# Patient Record
Sex: Female | Born: 1995 | Race: Black or African American | Hispanic: No | Marital: Single | Smoking: Never smoker
Health system: Southern US, Community
[De-identification: ages and names within clinical notes are randomized; demographics above are authoritative.]

## PROBLEM LIST (undated history)

## (undated) HISTORY — PX: NO PAST SURGERIES: SHX2092

---
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
## 2016-12-21 DIAGNOSIS — L739 Follicular disorder, unspecified: Secondary | ICD-10-CM | POA: Insufficient documentation

## 2016-12-21 DIAGNOSIS — B360 Pityriasis versicolor: Secondary | ICD-10-CM | POA: Insufficient documentation

## 2018-05-19 ENCOUNTER — Other Ambulatory Visit: Payer: Self-pay

## 2018-05-19 ENCOUNTER — Emergency Department (HOSPITAL_COMMUNITY)
Admission: EM | Admit: 2018-05-19 | Discharge: 2018-05-19 | Disposition: A | Discharge status: Home / Self Care | Payer: BLUE CROSS/BLUE SHIELD | Attending: Emergency Medicine | Admitting: Emergency Medicine

## 2018-05-19 ENCOUNTER — Encounter (HOSPITAL_COMMUNITY): Payer: Self-pay | Admitting: Emergency Medicine

## 2018-05-19 DIAGNOSIS — F410 Panic disorder [episodic paroxysmal anxiety] without agoraphobia: Secondary | ICD-10-CM | POA: Diagnosis not present

## 2018-05-19 DIAGNOSIS — R0789 Other chest pain: Secondary | ICD-10-CM | POA: Diagnosis present

## 2018-05-19 MED ORDER — HYDROXYZINE HCL 25 MG PO TABS: 25.0000 mg | ORAL_TABLET | Freq: Four times a day (QID) | ORAL | 0 refills | Status: DC | PRN

## 2018-05-19 NOTE — ED Provider Notes (Signed)
[facility] EMERGENCY DEPARTMENT Provider Note   CSN: [id number] Arrival date & time: 05/19/18  2201    History   Chief Complaint Chief Complaint  Patient presents with  . Chest Pain    HPI Carrie Fletcher is a 23 y.o. female.     HPI Patient presents with central chest aching and "anxiety attack" prior to coming into the emergency department.  States her symptoms have improved.  Denies shortness of breath, cough, fever or chills.  No recent extended travel immobilization.  No new lower extremity swelling or pain. History reviewed. No pertinent past medical history.  There are no active problems to display for this patient.   History reviewed. No pertinent surgical history.   OB History   No obstetric history on file.      Home Medications    Prior to Admission medications   Medication Sig Start Date End Date Taking? Authorizing Provider  hydrOXYzine (ATARAX/VISTARIL) 25 MG tablet Take 1 tablet (25 mg total) by mouth every 6 (six) hours as needed for anxiety. 05/19/18   Loren Racer, MD    Family History History reviewed. No pertinent family history.  Social History Social History   Tobacco Use  . Smoking status: Never Smoker  . Smokeless tobacco: Never Used  Substance Use Topics  . Alcohol use: Yes    Comment: occassionally   . Drug use: No     Allergies   Patient has no known allergies.   Review of Systems Review of Systems  Constitutional: Negative for chills and fever.  HENT: Negative for sore throat and trouble swallowing.   Eyes: Negative for visual disturbance.  Respiratory: Negative for cough and shortness of breath.   Cardiovascular: Positive for chest pain. Negative for palpitations and leg swelling.  Gastrointestinal: Negative for abdominal pain, constipation, diarrhea, nausea and vomiting.  Musculoskeletal: Negative for back pain, myalgias and neck pain.  Skin: Negative for rash.  Neurological: Negative for dizziness, weakness,  light-headedness, numbness and headaches.  Psychiatric/Behavioral: The patient is nervous/anxious.   All other systems reviewed and are negative.    Physical Exam Updated Vital Signs BP 122/80 (BP Location: Right Arm)   Pulse 70   Temp 98.8 F (37.1 C) (Oral)   Resp 18   Ht 5\' 4"  (1.626 m)   Wt 68 kg   LMP 04/30/2018   SpO2 98%   BMI 25.75 kg/m   Physical Exam Vitals signs and nursing note reviewed.  Constitutional:      General: She is not in acute distress.    Appearance: Normal appearance. She is well-developed. She is not ill-appearing.  HENT:     Head: Normocephalic and atraumatic.     Mouth/Throat:     Mouth: Mucous membranes are moist.  Eyes:     Pupils: Pupils are equal, round, and reactive to light.  Neck:     Musculoskeletal: Normal range of motion and neck supple. No neck rigidity or muscular tenderness.  Cardiovascular:     Rate and Rhythm: Normal rate and regular rhythm.     Pulses: Normal pulses.     Heart sounds: Normal heart sounds. No murmur. No friction rub. No gallop.   Pulmonary:     Effort: Pulmonary effort is normal. No respiratory distress.     Breath sounds: Normal breath sounds. No stridor. No wheezing, rhonchi or rales.  Chest:     Chest wall: No tenderness.  Abdominal:     General: Bowel sounds are normal.  Palpations: Abdomen is soft.     Tenderness: There is no abdominal tenderness. There is no right CVA tenderness, left CVA tenderness, guarding or rebound.  Musculoskeletal: Normal range of motion.        General: No swelling, tenderness, deformity or signs of injury.     Right lower leg: No edema.     Left lower leg: No edema.  Lymphadenopathy:     Cervical: No cervical adenopathy.  Skin:    General: Skin is warm and dry.     Capillary Refill: Capillary refill takes less than 2 seconds.     Findings: No erythema or rash.  Neurological:     General: No focal deficit present.     Mental Status: She is alert and oriented to  person, place, and time.  Psychiatric:        Mood and Affect: Mood normal.        Behavior: Behavior normal.      ED Treatments / Results  Labs (all labs ordered are listed, but only abnormal results are displayed) Labs Reviewed - No data to display  EKG EKG Interpretation  Date/Time:  Sunday May 19 2018 22:17:22 EDT Ventricular Rate:  64 PR Interval:    QRS Duration: 89 QT Interval:  396 QTC Calculation: 409 R Axis:   -79 Text Interpretation:  Sinus rhythm Confirmed by Averee Harb ([id number]) on 05/19/2018 11:03:37 PM   Radiology No results found.  Procedures Procedures (including critical care time)  Medications Ordered in ED Medications - No data to display   Initial Impression / Assessment and Plan / ED Course  I have reviewed the triage vital signs and the nursing notes.  Pertinent labs & imaging results that were available during my care of the patient were reviewed by me and considered in my medical decision making (see chart for details).       No concerning findings on EKG.  Vital signs are normal.  Lungs are clear.  Do not believe that imaging is necessary at this point.  Low suspicion for PE.  Offered reassurance.   Final Clinical Impressions(s) / ED Diagnoses   Final diagnoses:  Atypical chest pain  Anxiety attack    ED Discharge Orders         Ordered    hydrOXYzine (ATARAX/VISTARIL) 25 MG tablet  Every 6 hours PRN     03 /22/20 2234           Loren Racer, MD 05/19/18 2304

## 2018-05-19 NOTE — ED Triage Notes (Signed)
Pt c/o center "aching" chest pain x 2 hours, reports she had an "anxiety attack" prior to coming in, denies cough and chest congestion

## 2019-03-24 DIAGNOSIS — Z713 Dietary counseling and surveillance: Secondary | ICD-10-CM | POA: Diagnosis not present

## 2019-04-07 DIAGNOSIS — Z713 Dietary counseling and surveillance: Secondary | ICD-10-CM | POA: Diagnosis not present

## 2019-04-21 DIAGNOSIS — Z713 Dietary counseling and surveillance: Secondary | ICD-10-CM | POA: Diagnosis not present

## 2019-05-19 DIAGNOSIS — Z713 Dietary counseling and surveillance: Secondary | ICD-10-CM | POA: Diagnosis not present

## 2019-05-28 DIAGNOSIS — G8929 Other chronic pain: Secondary | ICD-10-CM | POA: Diagnosis not present

## 2019-05-28 DIAGNOSIS — R293 Abnormal posture: Secondary | ICD-10-CM | POA: Diagnosis not present

## 2019-05-28 DIAGNOSIS — M25519 Pain in unspecified shoulder: Secondary | ICD-10-CM | POA: Diagnosis not present

## 2019-06-13 DIAGNOSIS — N62 Hypertrophy of breast: Secondary | ICD-10-CM | POA: Diagnosis not present

## 2019-08-04 DIAGNOSIS — Z713 Dietary counseling and surveillance: Secondary | ICD-10-CM | POA: Diagnosis not present

## 2019-09-02 DIAGNOSIS — M545 Low back pain: Secondary | ICD-10-CM | POA: Diagnosis not present

## 2019-09-02 DIAGNOSIS — M542 Cervicalgia: Secondary | ICD-10-CM | POA: Diagnosis not present

## 2019-09-02 DIAGNOSIS — M546 Pain in thoracic spine: Secondary | ICD-10-CM | POA: Diagnosis not present

## 2019-09-02 DIAGNOSIS — M62830 Muscle spasm of back: Secondary | ICD-10-CM | POA: Diagnosis not present

## 2019-09-10 DIAGNOSIS — M545 Low back pain: Secondary | ICD-10-CM | POA: Diagnosis not present

## 2019-09-10 DIAGNOSIS — M62830 Muscle spasm of back: Secondary | ICD-10-CM | POA: Diagnosis not present

## 2019-09-10 DIAGNOSIS — M542 Cervicalgia: Secondary | ICD-10-CM | POA: Diagnosis not present

## 2019-09-10 DIAGNOSIS — M546 Pain in thoracic spine: Secondary | ICD-10-CM | POA: Diagnosis not present

## 2019-09-16 DIAGNOSIS — M542 Cervicalgia: Secondary | ICD-10-CM | POA: Diagnosis not present

## 2019-09-16 DIAGNOSIS — M545 Low back pain: Secondary | ICD-10-CM | POA: Diagnosis not present

## 2019-09-16 DIAGNOSIS — M62830 Muscle spasm of back: Secondary | ICD-10-CM | POA: Diagnosis not present

## 2019-09-16 DIAGNOSIS — M546 Pain in thoracic spine: Secondary | ICD-10-CM | POA: Diagnosis not present

## 2019-10-05 ENCOUNTER — Ambulatory Visit
Admission: EM | Admit: 2019-10-05 | Discharge: 2019-10-05 | Disposition: A | Discharge status: Home / Self Care | Payer: BC Managed Care – PPO | Attending: Emergency Medicine | Admitting: Emergency Medicine

## 2019-10-05 ENCOUNTER — Other Ambulatory Visit: Payer: Self-pay

## 2019-10-05 DIAGNOSIS — Z20822 Contact with and (suspected) exposure to covid-19: Secondary | ICD-10-CM

## 2019-10-05 NOTE — ED Triage Notes (Signed)
covid test needed for travel

## 2019-10-05 NOTE — Discharge Instructions (Signed)

## 2019-10-05 NOTE — ED Provider Notes (Addendum)
[facility]ARE [facility]ENTER   [id number] 10/05/19 Arrival Time: 0841   CC: COVID test  SUBJECTIVE: History from: patient.  Carrie Fletcher is a 24 y.o. female who presents for COVID testing.  Denies sick exposure to COVID, flu or strep.  Need test for travel.  Denies aggravating or alleviating symptoms.  Denies previous COVID infection.   Denies fever, chills, fatigue, nasal congestion, rhinorrhea, sore throat, cough, SOB, wheezing, chest pain, nausea, vomiting, changes in bowel or bladder habits.    Received COVID vaccines  ROS: As per HPI.  All other pertinent ROS negative.     No past medical history on file. No past surgical history on file. No Known Allergies No current facility-administered medications on file prior to encounter.   Current Outpatient Medications on File Prior to Encounter  Medication Sig Dispense Refill  . hydrOXYzine (ATARAX/VISTARIL) 25 MG tablet Take 1 tablet (25 mg total) by mouth every 6 (six) hours as needed for anxiety. 12 tablet 0   Social History   Socioeconomic History  . Marital status: Single    Spouse name: Not on file  . Number of children: Not on file  . Years of education: Not on file  . Highest education level: Not on file  Occupational History  . Not on file  Tobacco Use  . Smoking status: Never Smoker  . Smokeless tobacco: Never Used  Vaping Use  . Vaping Use: Never used  Substance and Sexual Activity  . Alcohol use: Yes    Comment: occassionally   . Drug use: No  . Sexual activity: Not on file  Other Topics Concern  . Not on file  Social History Narrative  . Not on file   Social Determinants of Health   Financial Resource Strain:   . Difficulty of Paying Living Expenses:   Food Insecurity:   . Worried About Programme researcher, broadcasting/film/video in the Last Year:   . Barista in the Last Year:   Transportation Needs:   . Freight forwarder (Medical):   Marland Kitchen Lack of Transportation (Non-Medical):   Physical Activity:   . Days of  Exercise per Week:   . Minutes of Exercise per Session:   Stress:   . Feeling of Stress :   Social Connections:   . Frequency of Communication with Friends and Family:   . Frequency of Social Gatherings with Friends and Family:   . Attends Religious Services:   . Active Member of Clubs or Organizations:   . Attends Banker Meetings:   Marland Kitchen Marital Status:   Intimate Partner Violence:   . Fear of Current or Ex-Partner:   . Emotionally Abused:   Marland Kitchen Physically Abused:   . Sexually Abused:    No family history on file.  OBJECTIVE:  Vitals:   10/05/19 0849  BP: 109/69  Pulse: (!) 47  Resp: 20  Temp: 98.3 F (36.8 C)  SpO2: 98%     General appearance: alert; well-appearing, nontoxic; speaking in full sentences and tolerating own secretions HEENT: NCAT; Ears: EACs clear; Eyes: PERRL.  EOM grossly intact.Nose: nares patent without rhinorrhea, Throat: oropharynx clear, tonsils non erythematous or enlarged, uvula midline  Neck: supple without LAD Lungs: unlabored respirations, symmetrical air entry; cough: absent; no respiratory distress; CTAB Heart: regular rate and rhythm.  Skin: warm and dry Psychological: alert and cooperative; normal mood and affect  ASSESSMENT & PLAN:  1. Encounter for laboratory testing for COVID-19 virus     COVID testing  ordered.  It will take between 2-5 days for test results.  Someone will contact you regarding abnormal results.    In the meantime:  If you were to develop symptoms: You should remain isolated in your home for 10 days from symptom onset AND greater than 72 hours after symptoms resolution (absence of fever without the use of fever-reducing medication and improvement in respiratory symptoms), whichever is longer If you have had exposure: you should remain in quarantine for 7 days from exposure.  However, if symptoms develop you must self isolate and return for retesting Get plenty of rest and push fluids Follow up with PCP as  needed Call or go to the ED if you have any new symptoms such as fever, cough, shortness of breath, chest tightness, chest pain, turning blue, changes in mental status, etc...   Reviewed expectations re: course of current medical issues. Questions answered. Outlined signs and symptoms indicating need for more acute intervention. Patient verbalized understanding. After Visit Summary given.         Rennis Harding, PA-C 10/05/19 0852    Rennis Harding, PA-C 10/05/19 [phone]

## 2019-10-06 LAB — SARS-COV-2, NAA 2 DAY TAT

## 2019-10-06 LAB — NOVEL CORONAVIRUS, NAA: SARS-CoV-2, NAA: NOT DETECTED

## 2019-11-03 DIAGNOSIS — Z9889 Other specified postprocedural states: Secondary | ICD-10-CM | POA: Insufficient documentation

## 2019-11-04 DIAGNOSIS — Z113 Encounter for screening for infections with a predominantly sexual mode of transmission: Secondary | ICD-10-CM | POA: Diagnosis not present

## 2019-11-04 DIAGNOSIS — Z01419 Encounter for gynecological examination (general) (routine) without abnormal findings: Secondary | ICD-10-CM | POA: Diagnosis not present

## 2019-11-04 DIAGNOSIS — Z13228 Encounter for screening for other metabolic disorders: Secondary | ICD-10-CM | POA: Diagnosis not present

## 2019-11-04 DIAGNOSIS — Z3040 Encounter for surveillance of contraceptives, unspecified: Secondary | ICD-10-CM | POA: Diagnosis not present

## 2019-11-18 DIAGNOSIS — M546 Pain in thoracic spine: Secondary | ICD-10-CM | POA: Diagnosis not present

## 2019-11-18 DIAGNOSIS — M542 Cervicalgia: Secondary | ICD-10-CM | POA: Diagnosis not present

## 2019-11-18 DIAGNOSIS — M545 Low back pain: Secondary | ICD-10-CM | POA: Diagnosis not present

## 2019-11-18 DIAGNOSIS — M62830 Muscle spasm of back: Secondary | ICD-10-CM | POA: Diagnosis not present

## 2020-01-19 DIAGNOSIS — N62 Hypertrophy of breast: Secondary | ICD-10-CM | POA: Diagnosis not present

## 2020-01-27 ENCOUNTER — Other Ambulatory Visit: Payer: Self-pay

## 2020-01-27 ENCOUNTER — Encounter (HOSPITAL_BASED_OUTPATIENT_CLINIC_OR_DEPARTMENT_OTHER): Payer: Self-pay | Admitting: Plastic Surgery

## 2020-01-27 NOTE — Progress Notes (Signed)
Pt's last echo and cardiology office notes reviewed with Dr Hyacinth Meeker. Because of apical vsd and left to right flow noted on echo pt will need cardiac clearance prior to surgery. Dr. Valene Bors office notified. Pre op call done with pt and she has an appt with Dr. Elizebeth Brooking for pre op clearance and follow up scheduled for tomorrow 01/28/20.  Will look in CE for that and place on chart.

## 2020-01-28 ENCOUNTER — Ambulatory Visit: Payer: Self-pay | Admitting: Plastic Surgery

## 2020-01-28 DIAGNOSIS — Z6827 Body mass index (BMI) 27.0-27.9, adult: Secondary | ICD-10-CM | POA: Diagnosis not present

## 2020-01-28 DIAGNOSIS — I422 Other hypertrophic cardiomyopathy: Secondary | ICD-10-CM | POA: Diagnosis not present

## 2020-01-28 DIAGNOSIS — Q210 Ventricular septal defect: Secondary | ICD-10-CM | POA: Diagnosis not present

## 2020-01-28 DIAGNOSIS — I371 Nonrheumatic pulmonary valve insufficiency: Secondary | ICD-10-CM | POA: Diagnosis not present

## 2020-01-28 MED ORDER — TRANEXAMIC ACID 1000 MG/10ML IV SOLN: 1000.0000 mg | INTRAVENOUS | Status: AC

## 2020-01-30 ENCOUNTER — Other Ambulatory Visit (HOSPITAL_COMMUNITY)
Admission: RE | Admit: 2020-01-30 | Discharge: 2020-01-30 | Disposition: A | Discharge status: Home / Self Care | Payer: BC Managed Care – PPO | Source: Ambulatory Visit | Attending: Plastic Surgery | Admitting: Plastic Surgery

## 2020-01-30 DIAGNOSIS — Z20822 Contact with and (suspected) exposure to covid-19: Secondary | ICD-10-CM | POA: Insufficient documentation

## 2020-01-30 DIAGNOSIS — Z01812 Encounter for preprocedural laboratory examination: Secondary | ICD-10-CM | POA: Insufficient documentation

## 2020-01-31 LAB — SARS CORONAVIRUS 2 (TAT 6-24 HRS): SARS Coronavirus 2: NEGATIVE

## 2020-02-03 ENCOUNTER — Encounter (HOSPITAL_BASED_OUTPATIENT_CLINIC_OR_DEPARTMENT_OTHER)
Admission: RE | Disposition: A | Discharge status: Home / Self Care | Payer: Self-pay | Source: Home / Self Care | Attending: Plastic Surgery

## 2020-02-03 ENCOUNTER — Ambulatory Visit (HOSPITAL_BASED_OUTPATIENT_CLINIC_OR_DEPARTMENT_OTHER)
Admission: RE | Admit: 2020-02-03 | Discharge: 2020-02-03 | Disposition: A | Discharge status: Home / Self Care | Payer: BC Managed Care – PPO | Attending: Plastic Surgery | Admitting: Plastic Surgery

## 2020-02-03 ENCOUNTER — Ambulatory Visit (HOSPITAL_BASED_OUTPATIENT_CLINIC_OR_DEPARTMENT_OTHER): Payer: BC Managed Care – PPO | Admitting: Certified Registered"

## 2020-02-03 ENCOUNTER — Encounter (HOSPITAL_BASED_OUTPATIENT_CLINIC_OR_DEPARTMENT_OTHER): Payer: Self-pay | Admitting: Plastic Surgery

## 2020-02-03 ENCOUNTER — Other Ambulatory Visit: Payer: Self-pay

## 2020-02-03 DIAGNOSIS — Q249 Congenital malformation of heart, unspecified: Secondary | ICD-10-CM | POA: Diagnosis not present

## 2020-02-03 DIAGNOSIS — Z793 Long term (current) use of hormonal contraceptives: Secondary | ICD-10-CM | POA: Diagnosis not present

## 2020-02-03 DIAGNOSIS — N62 Hypertrophy of breast: Secondary | ICD-10-CM | POA: Insufficient documentation

## 2020-02-03 HISTORY — PX: BREAST REDUCTION SURGERY: SHX8

## 2020-02-03 LAB — POCT PREGNANCY, URINE: Preg Test, Ur: NEGATIVE

## 2020-02-03 SURGERY — MAMMOPLASTY, REDUCTION
Anesthesia: General | Site: Breast | Laterality: Bilateral

## 2020-02-03 MED ORDER — FENTANYL CITRATE (PF) 100 MCG/2ML IJ SOLN
INTRAMUSCULAR | Status: AC
  Filled 2020-02-03: qty 2

## 2020-02-03 MED ORDER — ROCURONIUM BROMIDE 10 MG/ML (PF) SYRINGE
PREFILLED_SYRINGE | INTRAVENOUS | Status: AC
  Filled 2020-02-03: qty 20

## 2020-02-03 MED ORDER — SODIUM CHLORIDE 0.9 % IV SOLN
INTRAVENOUS | Status: DC | PRN
  Administered 2020-02-03: 120 mL

## 2020-02-03 MED ORDER — OXYCODONE HCL 5 MG PO TABS
5.0000 mg | ORAL_TABLET | Freq: Once | ORAL | Status: AC | PRN
  Administered 2020-02-03: 5 mg via ORAL

## 2020-02-03 MED ORDER — EPHEDRINE 5 MG/ML INJ
INTRAVENOUS | Status: AC
  Filled 2020-02-03: qty 10

## 2020-02-03 MED ORDER — LIDOCAINE HCL (CARDIAC) PF 100 MG/5ML IV SOSY
PREFILLED_SYRINGE | INTRAVENOUS | Status: DC | PRN
  Administered 2020-02-03: 60 mg via INTRAVENOUS

## 2020-02-03 MED ORDER — LIDOCAINE 2% (20 MG/ML) 5 ML SYRINGE
INTRAMUSCULAR | Status: AC
  Filled 2020-02-03: qty 5

## 2020-02-03 MED ORDER — PROPOFOL 500 MG/50ML IV EMUL
INTRAVENOUS | Status: AC
  Filled 2020-02-03: qty 100

## 2020-02-03 MED ORDER — PROPOFOL 10 MG/ML IV BOLUS
INTRAVENOUS | Status: AC
  Filled 2020-02-03: qty 20

## 2020-02-03 MED ORDER — ACETAMINOPHEN 500 MG PO TABS
ORAL_TABLET | ORAL | Status: AC
  Filled 2020-02-03: qty 2

## 2020-02-03 MED ORDER — OXYCODONE HCL 5 MG PO TABS
ORAL_TABLET | ORAL | Status: AC
  Filled 2020-02-03: qty 1

## 2020-02-03 MED ORDER — FENTANYL CITRATE (PF) 100 MCG/2ML IJ SOLN
INTRAMUSCULAR | Status: DC | PRN
  Administered 2020-02-03: 100 ug via INTRAVENOUS
  Administered 2020-02-03: 50 ug via INTRAVENOUS

## 2020-02-03 MED ORDER — SCOPOLAMINE 1 MG/3DAYS TD PT72
MEDICATED_PATCH | TRANSDERMAL | Status: AC
  Filled 2020-02-03: qty 1

## 2020-02-03 MED ORDER — ROCURONIUM BROMIDE 10 MG/ML (PF) SYRINGE
PREFILLED_SYRINGE | INTRAVENOUS | Status: AC
  Filled 2020-02-03: qty 10

## 2020-02-03 MED ORDER — OXYCODONE HCL 5 MG/5ML PO SOLN: 5.0000 mg | Freq: Once | ORAL | Status: AC | PRN

## 2020-02-03 MED ORDER — PHENYLEPHRINE 40 MCG/ML (10ML) SYRINGE FOR IV PUSH (FOR BLOOD PRESSURE SUPPORT)
PREFILLED_SYRINGE | INTRAVENOUS | Status: AC
  Filled 2020-02-03: qty 20

## 2020-02-03 MED ORDER — ONDANSETRON HCL 4 MG/2ML IJ SOLN
INTRAMUSCULAR | Status: DC | PRN
  Administered 2020-02-03: 4 mg via INTRAVENOUS

## 2020-02-03 MED ORDER — SUGAMMADEX SODIUM 200 MG/2ML IV SOLN
INTRAVENOUS | Status: DC | PRN
  Administered 2020-02-03: 200 mg via INTRAVENOUS

## 2020-02-03 MED ORDER — PROPOFOL 500 MG/50ML IV EMUL
INTRAVENOUS | Status: AC
  Filled 2020-02-03: qty 50

## 2020-02-03 MED ORDER — FENTANYL CITRATE (PF) 100 MCG/2ML IJ SOLN: 25.0000 ug | INTRAMUSCULAR | Status: DC | PRN

## 2020-02-03 MED ORDER — MIDAZOLAM HCL 2 MG/2ML IJ SOLN
INTRAMUSCULAR | Status: AC
  Filled 2020-02-03: qty 2

## 2020-02-03 MED ORDER — EPHEDRINE 5 MG/ML INJ
INTRAVENOUS | Status: AC
  Filled 2020-02-03: qty 20

## 2020-02-03 MED ORDER — CEFAZOLIN SODIUM-DEXTROSE 2-4 GM/100ML-% IV SOLN
2.0000 g | INTRAVENOUS | Status: AC
  Administered 2020-02-03: 2 g via INTRAVENOUS

## 2020-02-03 MED ORDER — SODIUM CHLORIDE (PF) 0.9 % IJ SOLN
INTRAMUSCULAR | Status: DC | PRN
  Administered 2020-02-03: 60 mL

## 2020-02-03 MED ORDER — DEXAMETHASONE SODIUM PHOSPHATE 4 MG/ML IJ SOLN
INTRAMUSCULAR | Status: DC | PRN
  Administered 2020-02-03: 10 mg via INTRAVENOUS

## 2020-02-03 MED ORDER — TRANEXAMIC ACID-NACL 1000-0.7 MG/100ML-% IV SOLN
INTRAVENOUS | Status: DC | PRN
  Administered 2020-02-03: 1000 mg via INTRAVENOUS

## 2020-02-03 MED ORDER — AMISULPRIDE (ANTIEMETIC) 5 MG/2ML IV SOLN: 10.0000 mg | Freq: Once | INTRAVENOUS | Status: DC | PRN

## 2020-02-03 MED ORDER — ROCURONIUM BROMIDE 100 MG/10ML IV SOLN
INTRAVENOUS | Status: DC | PRN
  Administered 2020-02-03: 70 mg via INTRAVENOUS
  Administered 2020-02-03: 20 mg via INTRAVENOUS

## 2020-02-03 MED ORDER — EPHEDRINE SULFATE 50 MG/ML IJ SOLN
INTRAMUSCULAR | Status: DC | PRN
  Administered 2020-02-03: 10 mg via INTRAVENOUS

## 2020-02-03 MED ORDER — DEXAMETHASONE SODIUM PHOSPHATE 10 MG/ML IJ SOLN
INTRAMUSCULAR | Status: AC
  Filled 2020-02-03: qty 1

## 2020-02-03 MED ORDER — CHLORHEXIDINE GLUCONATE CLOTH 2 % EX PADS: 6.0000 | MEDICATED_PAD | Freq: Once | CUTANEOUS | Status: DC

## 2020-02-03 MED ORDER — MIDAZOLAM HCL 5 MG/5ML IJ SOLN
INTRAMUSCULAR | Status: DC | PRN
  Administered 2020-02-03: 2 mg via INTRAVENOUS

## 2020-02-03 MED ORDER — ONDANSETRON HCL 4 MG/2ML IJ SOLN
INTRAMUSCULAR | Status: AC
  Filled 2020-02-03: qty 4

## 2020-02-03 MED ORDER — CEFAZOLIN SODIUM-DEXTROSE 2-4 GM/100ML-% IV SOLN
INTRAVENOUS | Status: AC
  Filled 2020-02-03: qty 100

## 2020-02-03 MED ORDER — LACTATED RINGERS IV SOLN: INTRAVENOUS | Status: DC

## 2020-02-03 MED ORDER — PROPOFOL 500 MG/50ML IV EMUL
INTRAVENOUS | Status: DC | PRN
  Administered 2020-02-03: 35 ug/kg/min via INTRAVENOUS

## 2020-02-03 MED ORDER — ACETAMINOPHEN 500 MG PO TABS
1000.0000 mg | ORAL_TABLET | Freq: Once | ORAL | Status: AC
  Administered 2020-02-03: 1000 mg via ORAL

## 2020-02-03 MED ORDER — PROPOFOL 10 MG/ML IV BOLUS
INTRAVENOUS | Status: DC | PRN
  Administered 2020-02-03: 150 mg via INTRAVENOUS

## 2020-02-03 MED ORDER — SCOPOLAMINE 1 MG/3DAYS TD PT72
1.0000 | MEDICATED_PATCH | TRANSDERMAL | Status: DC
  Administered 2020-02-03: 1.5 mg via TRANSDERMAL

## 2020-02-03 MED ORDER — PROMETHAZINE HCL 25 MG/ML IJ SOLN: 6.2500 mg | INTRAMUSCULAR | Status: DC | PRN

## 2020-02-03 SURGICAL SUPPLY — 60 items
BAG DECANTER FOR FLEXI CONT (MISCELLANEOUS) ×3 IMPLANT
BLADE KNIFE PERSONA 10 (BLADE) ×12 IMPLANT
BLADE KNIFE PERSONA 15 (BLADE) ×7 IMPLANT
BNDG GAUZE ELAST 4 BULKY (GAUZE/BANDAGES/DRESSINGS) ×6 IMPLANT
CANISTER SUCT 1200ML W/VALVE (MISCELLANEOUS) ×3 IMPLANT
CAP BOUFFANT 24 BLUE NURSES (PROTECTIVE WEAR) ×3 IMPLANT
CLOSURE WOUND 1/2 X4 (GAUZE/BANDAGES/DRESSINGS) ×5
COVER BACK TABLE 60X90IN (DRAPES) ×3 IMPLANT
COVER MAYO STAND STRL (DRAPES) ×3 IMPLANT
COVER WAND RF STERILE (DRAPES) IMPLANT
DECANTER SPIKE VIAL GLASS SM (MISCELLANEOUS) ×4 IMPLANT
DRAIN CHANNEL 10F 3/8 F FF (DRAIN) ×6 IMPLANT
DRAPE U-SHAPE 76X120 STRL (DRAPES) ×6 IMPLANT
DRSG EMULSION OIL 3X3 NADH (GAUZE/BANDAGES/DRESSINGS) ×6 IMPLANT
DRSG PAD ABDOMINAL 8X10 ST (GAUZE/BANDAGES/DRESSINGS) ×6 IMPLANT
ELECT BLADE 4.0 EZ CLEAN MEGAD (MISCELLANEOUS) ×3
ELECT REM PT RETURN 9FT ADLT (ELECTROSURGICAL) ×3
ELECTRODE BLDE 4.0 EZ CLN MEGD (MISCELLANEOUS) ×1 IMPLANT
ELECTRODE REM PT RTRN 9FT ADLT (ELECTROSURGICAL) ×1 IMPLANT
EVACUATOR SILICONE 100CC (DRAIN) ×6 IMPLANT
GAUZE SPONGE 4X4 12PLY STRL (GAUZE/BANDAGES/DRESSINGS) ×6 IMPLANT
GLOVE BIO SURGEON STRL SZ7 (GLOVE) ×9 IMPLANT
GOWN STRL REUS W/ TWL LRG LVL3 (GOWN DISPOSABLE) ×2 IMPLANT
GOWN STRL REUS W/TWL LRG LVL3 (GOWN DISPOSABLE) ×12
NDL HYPO 25X1 1.5 SAFETY (NEEDLE) ×3 IMPLANT
NDL SAFETY ECLIPSE 18X1.5 (NEEDLE) ×1 IMPLANT
NDL SPNL 18GX3.5 QUINCKE PK (NEEDLE) ×1 IMPLANT
NEEDLE HYPO 18GX1.5 SHARP (NEEDLE) ×3
NEEDLE HYPO 25X1 1.5 SAFETY (NEEDLE) ×9 IMPLANT
NEEDLE SPNL 18GX3.5 QUINCKE PK (NEEDLE) ×3 IMPLANT
NS IRRIG 1000ML POUR BTL (IV SOLUTION) ×6 IMPLANT
PACK BASIN DAY SURGERY FS (CUSTOM PROCEDURE TRAY) ×3 IMPLANT
PAD FOAM SILICONE BACKED (GAUZE/BANDAGES/DRESSINGS) ×1 IMPLANT
PENCIL SMOKE EVACUATOR (MISCELLANEOUS) ×3 IMPLANT
PIN SAFETY STERILE (MISCELLANEOUS) ×3 IMPLANT
SCRUB TECHNI CARE 4 OZ NO DYE (MISCELLANEOUS) ×3 IMPLANT
SLEEVE SCD COMPRESS KNEE MED (MISCELLANEOUS) ×3 IMPLANT
SPONGE LAP 18X18 RF (DISPOSABLE) ×11 IMPLANT
STAPLER VISISTAT 35W (STAPLE) ×5 IMPLANT
STRIP CLOSURE SKIN 1/2X4 (GAUZE/BANDAGES/DRESSINGS) ×9 IMPLANT
SUT ETHILON 3 0 PS 1 (SUTURE) ×3 IMPLANT
SUT MNCRL AB 3-0 PS2 18 (SUTURE) ×12 IMPLANT
SUT MNCRL AB 4-0 PS2 18 (SUTURE) ×6 IMPLANT
SUT MON AB 5-0 PS2 18 (SUTURE) ×6 IMPLANT
SUT PROLENE 2 0 CT2 30 (SUTURE) ×3 IMPLANT
SUT PROLENE 3 0 PS 1 (SUTURE) ×6 IMPLANT
SUT VLOC 90 P-14 23 (SUTURE) ×6 IMPLANT
SYR 20ML LL LF (SYRINGE) ×3 IMPLANT
SYR BULB IRRIG 60ML STRL (SYRINGE) ×6 IMPLANT
SYR CONTROL 10ML LL (SYRINGE) ×6 IMPLANT
TAPE MEASURE VINYL STERILE (MISCELLANEOUS) ×3 IMPLANT
TOWEL GREEN STERILE FF (TOWEL DISPOSABLE) ×9 IMPLANT
TRAY DSU PREP LF (CUSTOM PROCEDURE TRAY) ×3 IMPLANT
TRAY FOL W/BAG SLVR 16FR STRL (SET/KITS/TRAYS/PACK) IMPLANT
TRAY FOLEY W/BAG SLVR 14FR LF (SET/KITS/TRAYS/PACK) ×2 IMPLANT
TRAY FOLEY W/BAG SLVR 16FR LF (SET/KITS/TRAYS/PACK)
TUBE CONNECTING 20'X1/4 (TUBING) ×1
TUBE CONNECTING 20X1/4 (TUBING) ×2 IMPLANT
UNDERPAD 30X36 HEAVY ABSORB (UNDERPADS AND DIAPERS) ×6 IMPLANT
YANKAUER SUCT BULB TIP NO VENT (SUCTIONS) ×3 IMPLANT

## 2020-02-03 NOTE — Anesthesia Postprocedure Evaluation (Signed)
Anesthesia Post Note  Patient: Carrie Fletcher  Procedure(s) Performed: MAMMARY REDUCTION  (BREAST) (Bilateral Breast)     Patient location during evaluation: PACU Anesthesia Type: General Level of consciousness: awake Pain management: pain level controlled Vital Signs Assessment: post-procedure vital signs reviewed and stable Respiratory status: spontaneous breathing and respiratory function stable Cardiovascular status: stable Postop Assessment: no apparent nausea or vomiting Anesthetic complications: no   No complications documented.  Last Vitals:  Vitals:   02/03/20 1300 02/03/20 1325  BP: 110/71 128/83  Pulse: 66 (!) 58  Resp: (!) 22 20  Temp:  37.1 C  SpO2: 100% 97%    Last Pain:  Vitals:   02/03/20 1230  TempSrc:   PainSc: Asleep                 Mellody Dance

## 2020-02-03 NOTE — Transfer of Care (Signed)
Immediate Anesthesia Transfer of Care Note  Patient: Carrie Fletcher  Procedure(s) Performed: MAMMARY REDUCTION  (BREAST) (Bilateral Breast)  Patient Location: PACU  Anesthesia Type:General  Level of Consciousness: drowsy and patient cooperative  Airway & Oxygen Therapy: Patient Spontanous Breathing and Patient connected to face mask oxygen  Post-op Assessment: Report given to RN and Post -op Vital signs reviewed and stable  Post vital signs: Reviewed and stable  Last Vitals:  Vitals Value Taken Time  BP    Temp    Pulse    Resp    SpO2      Last Pain:  Vitals:   02/03/20 0648  TempSrc: Oral  PainSc: 0-No pain         Complications: No complications documented.

## 2020-02-03 NOTE — H&P (Signed)
  H&P faxed to surgical center.  -History and Physical Reviewed  -Patient has been re-examined  -No change in the plan of care  Carrie Fletcher A    

## 2020-02-03 NOTE — Discharge Instructions (Signed)
1. No lifting greater than 5 lbs with arms for 4 weeks. 2. Empty, strip, record and reactivate JP drains 3 times a day. 3. Percocet 5/325 mg tabs 1-2 tabs po q 4-6 hours prn pain- prescription given in office. 4. Duricef 1 tab po bid- prescription given in office. 5. Sterapred dose pack as directed- prescription given in office. 6. Follow-up appointment Friday in office.    Post Anesthesia Home Care Instructions  Activity: Get plenty of rest for the remainder of the day. A responsible individual must stay with you for 24 hours following the procedure.  For the next 24 hours, DO NOT: -Drive a car -Operate machinery -Drink alcoholic beverages -Take any medication unless instructed by your physician -Make any legal decisions or sign important papers.  Meals: Start with liquid foods such as gelatin or soup. Progress to regular foods as tolerated. Avoid greasy, spicy, heavy foods. If nausea and/or vomiting occur, drink only clear liquids until the nausea and/or vomiting subsides. Call your physician if vomiting continues.  Special Instructions/Symptoms: Your throat may feel dry or sore from the anesthesia or the breathing tube placed in your throat during surgery. If this causes discomfort, gargle with warm salt water. The discomfort should disappear within 24 hours.  If you had a scopolamine patch placed behind your ear for the management of post- operative nausea and/or vomiting:  1. The medication in the patch is effective for 72 hours, after which it should be removed.  Wrap patch in a tissue and discard in the trash. Wash hands thoroughly with soap and water. 2. You may remove the patch earlier than 72 hours if you experience unpleasant side effects which may include dry mouth, dizziness or visual disturbances. 3. Avoid touching the patch. Wash your hands with soap and water after contact with the patch.    Information for Discharge Teaching: EXPAREL (bupivacaine liposome injectable  suspension)   Your surgeon or anesthesiologist gave you EXPAREL(bupivacaine) to help control your pain after surgery.   EXPAREL is a local anesthetic that provides pain relief by numbing the tissue around the surgical site.  EXPAREL is designed to release pain medication over time and can control pain for up to 72 hours.  Depending on how you respond to EXPAREL, you may require less pain medication during your recovery.  Possible side effects:  Temporary loss of sensation or ability to move in the area where bupivacaine was injected.  Nausea, vomiting, constipation  Rarely, numbness and tingling in your mouth or lips, lightheadedness, or anxiety may occur.  Call your doctor right away if you think you may be experiencing any of these sensations, or if you have other questions regarding possible side effects.  Follow all other discharge instructions given to you by your surgeon or nurse. Eat a healthy diet and drink plenty of water or other fluids.  If you return to the hospital for any reason within 96 hours following the administration of EXPAREL, it is important for health care providers to know that you have received this anesthetic. A teal colored band has been placed on your arm with the date, time and amount of EXPAREL you have received in order to alert and inform your health care providers. Please leave this armband in place for the full 96 hours following administration, and then you may remove the band.    JP Drain Totals  Bring this sheet to all of your post-operative appointments while you have your drains.  Please measure your drains by CC's   or ML's.  Make sure you drain and measure your JP Drains 2 or 3 times per day.  At the end of each day, add up totals for the left side and add up totals for the right side.    ( 9 am )     ( 3 pm )        ( 9 pm )                Date L  R  L  R  L  R  Total L/R                                                                                                                                                                                             

## 2020-02-03 NOTE — Brief Op Note (Signed)
02/03/2020  11:43 AM  PATIENT:  Carrie Fletcher  24 y.o. female  PRE-OPERATIVE DIAGNOSIS:  BILATERAL MACROMASTIA  POST-OPERATIVE DIAGNOSIS:  BILATERAL MACROMASTIA  PROCEDURE:  Procedure(s): MAMMARY REDUCTION  (BREAST) (Bilateral)  SURGEON:  Surgeon(s) and Role:    * Liahna Brickner, Chales Abrahams, MD - Primary  ASSISTANTS: Adrienne Mocha, RNFA   ANESTHESIA:   general  EBL:  100 mL   BLOOD ADMINISTERED:none  DRAINS: (51F) Jackson-Pratt drain(s) with closed bulb suction in the Bilateral Breasts   LOCAL MEDICATIONS USED:  1.3% Exparel (total 266 mgs.)  SPECIMEN:  Source of Specimen:  Bilateral Breasts  DISPOSITION OF SPECIMEN:  PATHOLOGY  COUNTS:  YES  DICTATION: .Note written in EPIC  PLAN OF CARE: Discharge to home after PACU  PATIENT DISPOSITION:  PACU - hemodynamically stable.   Delay start of Pharmacological VTE agent (>24hrs) due to surgical blood loss or risk of bleeding: not applicable

## 2020-02-03 NOTE — Anesthesia Preprocedure Evaluation (Signed)
Anesthesia Evaluation  Patient identified by MRN, date of birth, ID band Patient awake    Reviewed: Allergy & Precautions, NPO status , Patient's Chart, lab work & pertinent test results  Airway Mallampati: II  TM Distance: >3 FB Neck ROM: Full    Dental no notable dental hx.    Pulmonary neg pulmonary ROS,    Pulmonary exam normal breath sounds clear to auscultation       Cardiovascular negative cardio ROS Normal cardiovascular exam Rhythm:Regular Rate:Normal     Neuro/Psych negative neurological ROS  negative psych ROS   GI/Hepatic negative GI ROS, Neg liver ROS,   Endo/Other  negative endocrine ROS  Renal/GU negative Renal ROS  negative genitourinary   Musculoskeletal negative musculoskeletal ROS (+)   Abdominal Normal abdominal exam  (+)   Peds  (+) Congenital Heart Disease Hematology negative hematology ROS (+)   Anesthesia Other Findings   Reproductive/Obstetrics negative OB ROS                             Anesthesia Physical Anesthesia Plan  ASA: II  Anesthesia Plan: General   Post-op Pain Management:    Induction: Intravenous  PONV Risk Score and Plan: 3 and Dexamethasone, Ondansetron, Midazolam and Scopolamine patch - Pre-op  Airway Management Planned: Oral ETT  Additional Equipment:   Intra-op Plan:   Post-operative Plan: Extubation in OR  Informed Consent: I have reviewed the patients History and Physical, chart, labs and discussed the procedure including the risks, benefits and alternatives for the proposed anesthesia with the patient or authorized representative who has indicated his/her understanding and acceptance.     Dental advisory given  Plan Discussed with: CRNA and Anesthesiologist  Anesthesia Plan Comments:         Anesthesia Quick Evaluation

## 2020-02-03 NOTE — Op Note (Signed)
OPERATIVE REPORT  02/03/2020  Carrie Fletcher  PREOPERATIVE DIAGNOSIS:  Bilateral Macromastia.  POSTOPERATIVE DIAGNOSIS:  Bilateral Macromastia.  PROCEDURE:  Bilateral Reduction Mammoplasties.  ATTENDING SURGEON:  Eloise Levels, MD  ASSISTANT: Ronda Fairly, RNFA  ANESTHESIA:  General.  ANESTHESIOLOGISTCandis Schatz , MD  COMPLICATIONS:  None.  INDICATIONS FOR THE PROCEDURE:  The patient is a 24 y.o. female who has bilateral macromastia that is clinically symptomatic.  She presents to undergo bilateral reduction mammoplasties.  DESCRIPTION OF PROCEDURE:  The patient was marked in preop holding area in a pattern of Wise for the future bilateral reduction mammoplasties. She was then taken back to the OR, placed on the table in supine position.  After adequate general anesthesia was obtained, the patient's chest was prepped with Techni-Care and draped in sterile fashion.  The bases of the breasts have been infiltrated with 1% lidocaine with epinephrine.  After adequate hemostasis and anesthesia taken effect, the procedure was begun.  Both of the breast reductions were performed in the following similar manner.  The nipple-areolar complex was marked with a 45-mm nipple marker.  The skin was then incised and deepithelialized around the nipple-areolar complex down to the inframammary crease in the inferior pedicle pattern.  Next, the medial, superior, and lateral skin flaps were elevated down to the chest wall.  Excess fat and glandular tissue removed from the inferior pedicle.  The nipple-areolar complex was examined and found to be pink and viable.  The wound was irrigated with saline irrigation.  Meticulous hemostasis was obtained with the Bovie electrocautery.  Inferior pedicle was centralized using 3-0 Prolene suture.  A #10 JP flat fully fluted drain was placed into the wound. The skin flaps were brought together at the inverted T junction with a 2- 0 Prolene  suture.  The incisions were stapled for temporary closure. The breasts compared and found to have good shape and symmetry.  The incisions were then closed from the medial aspect of the JP drain to the medial aspect of the [facility] incision by first placing a few 3-0 Monocryl sutures to tack together the dermal layer, and then both the dermal and cuticular layer were closed in a single layer using a 3-0 V-Lock barbed suture.  Lateral to the JP drain incision was closed using 3-0 Monocryl in the dermal layer, followed by 3-0 Monocryl running intracuticular stitch on the skin.  The vertical limb of the Wise pattern was closed in the dermal layer using 3-0 Monocryl suture.  The patient was placed in the upright position.  The future location of the nipple-areolar complexes was marked on both breast mounds using the 42-mm nipple marker.  She was then placed back in the recumbent position.  Both of the nipple areolar complexes were brought out onto the breast mounds in the following similar manner.  The skin was incised as marked and removed in full thickness into the subcutaneous tissues.  The nipple- areolar complex was examined, found to be pink and viable, then brought out through this aperture and sewn in place using 4-0 Monocryl in the dermal layer, followed by 5-0 Monocryl running intracuticular stitch on the skin.  This 5-0 Monocryl suture was then brought down to close the cuticular layer of the vertical limb as well.  The JP drain was sewn in place using 3-0 nylon suture.  The pectoralis major muscle and fascia along with the breast and chest soft tissues were then infiltrated with 1% Exparel (total 266 mg).  Now the  IMC incision was also infiltrated with the Exparel in order to give the patient postoperative pain control.  The incisions were dressed with Steri-Strips and the nipples dressed with bacitracin ointment and Adaptic.  4x4s were placed over the incisions and ABD pads in the  axillary areas.  The patient was placed into a light postoperative support bra.  There were no complications. The patient tolerated the procedure well.  The final needle, sponge counts were reported to be correct at the end of the case.  The patient was then recovered without complications.  Both the patient and her family were given proper postoperative wound care instructions. She was then discharged home in the care of her family in stable condition.  Follow up will be with me in a few days in the office.         Eloise Levels, M.D.  02/03/2020 11:39 AM

## 2020-02-03 NOTE — Anesthesia Procedure Notes (Signed)
Procedure Name: Intubation Date/Time: 02/03/2020 7:41 AM Performed by: Signe Colt, CRNA Pre-anesthesia Checklist: Patient identified, Emergency Drugs available, Suction available and Patient being monitored Patient Re-evaluated:Patient Re-evaluated prior to induction Oxygen Delivery Method: Circle system utilized Preoxygenation: Pre-oxygenation with 100% oxygen Induction Type: IV induction Ventilation: Mask ventilation without difficulty Laryngoscope Size: Mac and 3 Grade View: Grade I Tube type: Oral Tube size: 7.0 mm Number of attempts: 1 Airway Equipment and Method: Stylet and Oral airway Placement Confirmation: ETT inserted through vocal cords under direct vision,  positive ETCO2 and breath sounds checked- equal and bilateral Secured at: 21 cm Tube secured with: Tape Dental Injury: Teeth and Oropharynx as per pre-operative assessment

## 2020-02-04 ENCOUNTER — Encounter (HOSPITAL_BASED_OUTPATIENT_CLINIC_OR_DEPARTMENT_OTHER): Payer: Self-pay | Admitting: Plastic Surgery

## 2020-02-04 LAB — SURGICAL PATHOLOGY

## 2020-02-22 DIAGNOSIS — Z03818 Encounter for observation for suspected exposure to other biological agents ruled out: Secondary | ICD-10-CM | POA: Diagnosis not present

## 2020-02-23 DIAGNOSIS — Z20822 Contact with and (suspected) exposure to covid-19: Secondary | ICD-10-CM | POA: Diagnosis not present

## 2020-02-25 ENCOUNTER — Encounter: Payer: Self-pay | Admitting: Emergency Medicine

## 2020-02-25 ENCOUNTER — Ambulatory Visit
Admission: EM | Admit: 2020-02-25 | Discharge: 2020-02-25 | Disposition: A | Discharge status: Home / Self Care | Payer: BC Managed Care – PPO | Attending: Family Medicine | Admitting: Family Medicine

## 2020-02-25 ENCOUNTER — Other Ambulatory Visit: Payer: Self-pay

## 2020-02-25 DIAGNOSIS — R059 Cough, unspecified: Secondary | ICD-10-CM | POA: Diagnosis not present

## 2020-02-25 DIAGNOSIS — Z1159 Encounter for screening for other viral diseases: Secondary | ICD-10-CM | POA: Diagnosis not present

## 2020-02-25 DIAGNOSIS — B349 Viral infection, unspecified: Secondary | ICD-10-CM | POA: Diagnosis not present

## 2020-02-25 DIAGNOSIS — J029 Acute pharyngitis, unspecified: Secondary | ICD-10-CM

## 2020-02-25 DIAGNOSIS — R0981 Nasal congestion: Secondary | ICD-10-CM | POA: Diagnosis not present

## 2020-02-25 MED ORDER — PROMETHAZINE-DM 6.25-15 MG/5ML PO SYRP: 5.0000 mL | ORAL_SOLUTION | Freq: Four times a day (QID) | ORAL | 0 refills | Status: DC | PRN

## 2020-02-25 NOTE — ED Provider Notes (Signed)
[facility]   [id number] 02/25/20 Arrival Time: 1425   CC: COVID symptoms  SUBJECTIVE: History from: patient.  Carrie Fletcher is a 24 y.o. female who presents with abrupt onset of nasal congestion, PND, cough, fever, fatigue for the last 3 days. Denies sick exposure to COVID, flu or strep. Denies recent travel. Has negative history of Covid. Has completed Covid vaccines. Has not taken OTC medications for this. There are no aggravating or alleviating factors. Denies previous symptoms in the past. Denies sinus pain, rhinorrhea, sore throat, SOB, wheezing, chest pain, nausea, changes in bowel or bladder habits.    ROS: As per HPI.  All other pertinent ROS negative.     Past Medical History:  Diagnosis Date  . 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 BREAST REDUCTION SURGERY Bilateral 02/03/2020   Procedure: MAMMARY REDUCTION  (BREAST);  Surgeon: Contogiannis, Chales Abrahams, MD;  Location: [facility];  Service: Plastics;  Laterality: Bilateral;  . NO PAST SURGERIES     No Known Allergies No current facility-administered medications on file prior to encounter.   Current Outpatient Medications on File Prior to Encounter  Medication Sig Dispense Refill  . Norgestimate-Eth Estradiol (SPRINTEC 28 PO) Take by mouth.     Social History   Socioeconomic History  . Marital status: Single    Spouse name: Not on file  . Number of children: Not on file  . Years of education: Not on file  . Highest education level: Not on file  Occupational History  . Not on file  Tobacco Use  . Smoking status: Never Smoker  . Smokeless tobacco: Never Used  Vaping Use  . Vaping Use: Never used  Substance and Sexual Activity  . Alcohol use: Yes    Comment: occassionally   . Drug use: No  . Sexual activity: Not on file  Other Topics Concern  . Not on  file  Social History Narrative  . Not on file   Social Determinants of Health   Financial Resource Strain: Not on file  Food Insecurity: Not on file  Transportation Needs: Not on file  Physical Activity: Not on file  Stress: Not on file  Social Connections: Not on file  Intimate Partner Violence: Not on file   Family History  Problem Relation Age of Onset  . Cardiomyopathy Father     OBJECTIVE:  Vitals:   02/25/20 1503  BP: 106/74  Pulse: 84  Resp: 16  Temp: 98.5 F (36.9 C)  TempSrc: Oral  SpO2: 98%     General appearance: alert; appears fatigued, but nontoxic; speaking in full sentences and tolerating own secretions HEENT: NCAT; Ears: EACs clear, TMs pearly gray; Eyes: PERRL.  EOM grossly intact. Sinuses: nontender; Nose: nares patent without rhinorrhea, Throat: oropharynx mildly erythematous, cobblestoning present, tonsils non erythematous or enlarged, uvula midline  Neck: supple without LAD Lungs: unlabored respirations, symmetrical air entry; cough: absent; no respiratory distress; CTAB Heart: regular rate and rhythm.  Radial pulses 2+ symmetrical bilaterally Skin: warm and dry Psychological: alert and cooperative; normal mood and affect  LABS:  No results found for this or any previous visit (from the past 24 hour(s)).   ASSESSMENT & PLAN:  1. Viral illness   2. Cough   3. Sore throat   4. Nasal congestion     Meds ordered this encounter  Medications  . promethazine-dextromethorphan (  PROMETHAZINE-DM) 6.25-15 MG/5ML syrup    Sig: Take 5 mLs by mouth 4 (four) times daily as needed for cough.    Dispense:  118 mL    Refill:  0    Order Specific Question:   Supervising Provider    Answer:   Merrilee Jansky [id number]    Prescribed cough syrup Sedation precautions given COVID and flu testing ordered.  It will take between 1-2 days for test results.  Someone will contact you regarding abnormal results.    Patient should remain in quarantine until  they have received Covid results.  If negative you may resume normal activities (go back to work/school) while practicing hand hygiene, social distance, and mask wearing.  If positive, patient should remain in quarantine for 10 days from symptom onset AND greater than 72 hours after symptoms resolution (absence of fever without the use of fever-reducing medication and improvement in respiratory symptoms), whichever is longer Get plenty of rest and push fluids Use OTC zyrtec for nasal congestion, runny nose, and/or sore throat Use OTC flonase for nasal congestion and runny nose Use medications daily for symptom relief Use OTC medications like ibuprofen or tylenol as needed fever or pain Call or go to the ED if you have any new or worsening symptoms such as fever, worsening cough, shortness of breath, chest tightness, chest pain, turning blue, changes in mental status.  Reviewed expectations re: course of current medical issues. Questions answered. Outlined signs and symptoms indicating need for more acute intervention. Patient verbalized understanding. After Visit Summary given.         Moshe Cipro, NP 02/25/20 [phone]

## 2020-02-25 NOTE — Discharge Instructions (Signed)
I have sent in cough syrup for you to take. This medication can make you sleepy. Do not drive while taking this medication.  Your COVID and Flu tests are pending.  You should self quarantine until the test results are back.    Take Tylenol or ibuprofen as needed for fever or discomfort.  Rest and keep yourself hydrated.    Follow-up with your primary care provider if your symptoms are not improving.     

## 2020-02-25 NOTE — ED Triage Notes (Signed)
Cough, scratchy throat and congestion since Sunday

## 2020-02-26 LAB — COVID-19, FLU A+B NAA
Influenza A, NAA: NOT DETECTED
Influenza B, NAA: NOT DETECTED
SARS-CoV-2, NAA: DETECTED — AB

## 2020-04-04 ENCOUNTER — Other Ambulatory Visit: Payer: Self-pay

## 2020-04-04 ENCOUNTER — Encounter: Payer: Self-pay | Admitting: Emergency Medicine

## 2020-04-04 ENCOUNTER — Ambulatory Visit
Admission: EM | Admit: 2020-04-04 | Discharge: 2020-04-04 | Disposition: A | Discharge status: Home / Self Care | Payer: BC Managed Care – PPO | Attending: Family Medicine | Admitting: Family Medicine

## 2020-04-04 DIAGNOSIS — J039 Acute tonsillitis, unspecified: Secondary | ICD-10-CM | POA: Diagnosis not present

## 2020-04-04 DIAGNOSIS — Z20822 Contact with and (suspected) exposure to covid-19: Secondary | ICD-10-CM | POA: Diagnosis not present

## 2020-04-04 MED ORDER — AMOXICILLIN 500 MG PO TABS: 500.0000 mg | ORAL_TABLET | Freq: Two times a day (BID) | ORAL | 0 refills | Status: AC

## 2020-04-04 NOTE — Discharge Instructions (Addendum)
I have sent in amoxicillin for you to take twice a day for 7 days  Your COVID test is pending. You should self quarantine until the test result is back.    Take Tylenol or ibuprofen as needed for fever or discomfort.  Rest and keep yourself hydrated.    Follow-up with your primary care provider if your symptoms are not improving.

## 2020-04-04 NOTE — ED Provider Notes (Signed)
[facility]   [id number] 04/04/20 Arrival Time: 1048   CC: COVID symptoms  SUBJECTIVE: History from: patient.  Carrie Fletcher is a 25 y.o. female who presents with fever, fatigue, headache, sore throat since yesterday. Denies sick exposure to COVID, flu or strep. Denies recent travel. Has postive history of Covid. Has completed Covid vaccines. Has completed flu vaccine this year. Has not taken OTC medications for this. Throat pain is worse with swallowing. Tolerating secretions well. Denies previous symptoms in the past. Denies fever, chills, fatigue, sinus pain, rhinorrhea, SOB, wheezing, chest pain, nausea, changes in bowel or bladder habits.    ROS: As per HPI.  All other pertinent ROS negative.     Past Medical History:  Diagnosis Date  . 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 BREAST REDUCTION SURGERY Bilateral 02/03/2020   Procedure: MAMMARY REDUCTION  (BREAST);  Surgeon: Contogiannis, Chales Abrahams, MD;  Location: [facility];  Service: Plastics;  Laterality: Bilateral;  . NO PAST SURGERIES     No Known Allergies No current facility-administered medications on file prior to encounter.   Current Outpatient Medications on File Prior to Encounter  Medication Sig Dispense Refill  . Norgestimate-Eth Estradiol (SPRINTEC 28 PO) Take by mouth.    . promethazine-dextromethorphan (PROMETHAZINE-DM) 6.25-15 MG/5ML syrup Take 5 mLs by mouth 4 (four) times daily as needed for cough. 118 mL 0   Social History   Socioeconomic History  . Marital status: Single    Spouse name: Not on file  . Number of children: Not on file  . Years of education: Not on file  . Highest education level: Not on file  Occupational History  . Not on file  Tobacco Use  . Smoking status: Never Smoker  . Smokeless tobacco: Never Used  Vaping Use  . Vaping  Use: Never used  Substance and Sexual Activity  . Alcohol use: Yes    Comment: occassionally   . Drug use: No  . Sexual activity: Not on file  Other Topics Concern  . Not on file  Social History Narrative  . Not on file   Social Determinants of Health   Financial Resource Strain: Not on file  Food Insecurity: Not on file  Transportation Needs: Not on file  Physical Activity: Not on file  Stress: Not on file  Social Connections: Not on file  Intimate Partner Violence: Not on file   Family History  Problem Relation Age of Onset  . Cardiomyopathy Father     OBJECTIVE:  Vitals:   04/04/20 1054 04/04/20 1057  BP: 124/76   Pulse: 83   Resp: 17   Temp: 98.2 F (36.8 C)   TempSrc: Tympanic   SpO2: 96%   Weight:  158 lb (71.7 kg)  Height:  5\' 5"  (1.651 m)     General appearance: alert; appears fatigued, but nontoxic; speaking in full sentences and tolerating own secretions HEENT: NCAT; Ears: EACs clear, TMs pearly gray; Eyes: PERRL.  EOM grossly intact. Sinuses: nontender; Nose: nares patent with clear rhinorrhea, Throat: oropharynx erythematous, cobblestoning present, tonsils erythematous 2+ enlarged with white patchy exudate, uvula midline and erythematous Neck: supple without LAD Lungs: unlabored respirations, symmetrical air entry; cough: absent; no respiratory distress; CTAB Heart: regular rate and rhythm.  Radial pulses 2+ symmetrical bilaterally Skin: warm and dry Psychological: alert and cooperative; normal mood and affect  LABS:  No results found for this or any previous visit (from the past 24 hour(s)).   ASSESSMENT & PLAN:  1. Acute tonsillitis, unspecified etiology   2. Exposure to COVID-19 virus     Meds ordered this encounter  Medications  . amoxicillin (AMOXIL) 500 MG tablet    Sig: Take 1 tablet (500 mg total) by mouth 2 (two) times daily for 7 days.    Dispense:  14 tablet    Refill:  0    Order Specific Question:   Supervising Provider     Answer:   Merrilee Jansky [id number]   Prescribed amoxicillin BID x 7 days for tonsillitis Continue supportive care at home COVID and flu testing ordered.  It will take between 2-3 days for test results. Someone will contact you regarding abnormal results.   Work note provided Patient should remain in quarantine until they have received Covid results.  If negative you may resume normal activities (go back to work/school) while practicing hand hygiene, social distance, and mask wearing.  If positive, patient should remain in quarantine for at least 5 days from symptom onset AND greater than 72 hours after symptoms resolution (absence of fever without the use of fever-reducing medication and improvement in respiratory symptoms), whichever is longer Get plenty of rest and push fluids Use OTC zyrtec for nasal congestion, runny nose, and/or sore throat Use OTC flonase for nasal congestion and runny nose Use medications daily for symptom relief Use OTC medications like ibuprofen or tylenol as needed fever or pain Call or go to the ED if you have any new or worsening symptoms such as fever, worsening cough, shortness of breath, chest tightness, chest pain, turning blue, changes in mental status.  Reviewed expectations re: course of current medical issues. Questions answered. Outlined signs and symptoms indicating need for more acute intervention. Patient verbalized understanding. After Visit Summary given.         Moshe Cipro, NP 04/04/20 1203

## 2020-04-04 NOTE — ED Triage Notes (Signed)
Fever since yesterday, feels tired and headache.  Was covid positive dec 29th.

## 2020-04-06 LAB — COVID-19, FLU A+B NAA
Influenza A, NAA: NOT DETECTED
Influenza B, NAA: NOT DETECTED
SARS-CoV-2, NAA: NOT DETECTED

## 2020-11-23 DIAGNOSIS — Z3040 Encounter for surveillance of contraceptives, unspecified: Secondary | ICD-10-CM | POA: Diagnosis not present

## 2020-11-23 DIAGNOSIS — Z01419 Encounter for gynecological examination (general) (routine) without abnormal findings: Secondary | ICD-10-CM | POA: Diagnosis not present

## 2020-11-23 DIAGNOSIS — R7989 Other specified abnormal findings of blood chemistry: Secondary | ICD-10-CM | POA: Diagnosis not present

## 2020-11-23 DIAGNOSIS — R748 Abnormal levels of other serum enzymes: Secondary | ICD-10-CM | POA: Diagnosis not present

## 2020-11-23 DIAGNOSIS — Z113 Encounter for screening for infections with a predominantly sexual mode of transmission: Secondary | ICD-10-CM | POA: Diagnosis not present

## 2020-12-13 DIAGNOSIS — N62 Hypertrophy of breast: Secondary | ICD-10-CM | POA: Diagnosis not present

## 2021-01-28 ENCOUNTER — Other Ambulatory Visit: Payer: Self-pay

## 2021-01-28 ENCOUNTER — Encounter (HOSPITAL_BASED_OUTPATIENT_CLINIC_OR_DEPARTMENT_OTHER): Payer: Self-pay | Admitting: Nurse Practitioner

## 2021-01-28 ENCOUNTER — Ambulatory Visit (INDEPENDENT_AMBULATORY_CARE_PROVIDER_SITE_OTHER): Payer: BC Managed Care – PPO | Admitting: Nurse Practitioner

## 2021-01-28 VITALS — BP 117/72 | HR 86 | Ht 64.0 in | Wt 155.0 lb

## 2021-01-28 DIAGNOSIS — Z1321 Encounter for screening for nutritional disorder: Secondary | ICD-10-CM | POA: Diagnosis not present

## 2021-01-28 DIAGNOSIS — R5383 Other fatigue: Secondary | ICD-10-CM | POA: Insufficient documentation

## 2021-01-28 DIAGNOSIS — Z0000 Encounter for general adult medical examination without abnormal findings: Secondary | ICD-10-CM | POA: Insufficient documentation

## 2021-01-28 DIAGNOSIS — Z13228 Encounter for screening for other metabolic disorders: Secondary | ICD-10-CM | POA: Diagnosis not present

## 2021-01-28 DIAGNOSIS — Z1329 Encounter for screening for other suspected endocrine disorder: Secondary | ICD-10-CM

## 2021-01-28 DIAGNOSIS — Z130 Encounter for screening for diseases of the blood and blood-forming organs and certain disorders involving the immune mechanism: Secondary | ICD-10-CM | POA: Diagnosis not present

## 2021-01-28 DIAGNOSIS — R748 Abnormal levels of other serum enzymes: Secondary | ICD-10-CM | POA: Diagnosis not present

## 2021-01-28 NOTE — Progress Notes (Signed)
Orma Render, DNP, AGNP-c Primary Care & Sports Medicine [address]  [place], [place] [address] [phone]  New patient visit   Patient: Carrie Fletcher   DOB: 1995/04/13   25 y.o. Female  MRN: [id number] Visit Date: 01/28/2021  Patient Care Team: Vashti Bolanos, Coralee Pesa, NP as PCP - General (Nurse Practitioner)  Today's healthcare provider: Orma Render, NP   Chief Complaint  Patient presents with   New Patient (Initial Visit)    Patient is in the office to establish. She would like a complete lab work up. She sleeps 8-9 hours at night, but feels midday she is crashing. No other concerns at this time. 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 She would like a complete lab work up. She sleeps 8-9 hours at night, but feels midday she is crashing. No other concerns at this time. 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      Carrie Fletcher is a 25 y.o. female who presents today as a new patient to establish care.    Carrie Fletcher reports concerns today with increased fatigue on daily basis.  This has been ongoing for some time now.  She reports getting at least 8 hours of sleep, but occasionally wakes feeling tired. No snoring hx She endorses that she notices her symptoms are worse when she has not eaten well that day, but states that it is still present when diet is good.  She tried multivitamins with VitD, B12, Calcium, Magnesium, and other minerals with no change.  She recently had labs drawn with GYN, which were reviewed today to show no low hgb levels or kidney dysfunction. BG was normal low.  She denies concerns with depression or anxiety.  She denies heavy menses, sensation of fullness in her throat, sensitivity to heat or cold, unsual weight loss or weight gain.   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  She currently lives with her parents and her sister but will move into her own apartment when she goes to school  She is currently single and does not have any children.  She reports she feels safe at home and in her current relationships. She eats an overall healthy diet that is well balanced. She works out almost daily with weight lifting, cycling, and running.  She only uses alcohol on social occassions, she denies recreational drug use or nicotine use She is sexually active and had recent STI screening in September with GYN, which was all negative. Reviewed results today.  She is UTD on her flu vaccine, she has had 2 initial COVID vaccines and one booster, she had a updated Tdap last month.  She completed her HPV vaccine series as a teenager.  She is UTD on her pap.   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   Past Surgical History:  Procedure Laterality Date   BREAST REDUCTION SURGERY Bilateral 02/03/2020   Procedure: MAMMARY REDUCTION  (BREAST);  Surgeon: Youlanda Roys,  MD;  Location: [place];  Service: Plastics;  Laterality: Bilateral;   NO PAST SURGERIES     Family Status  Relation Name Status   Father  (Not Specified)   Family History  Problem Relation Age of Onset   Cardiomyopathy Father    Social History   Socioeconomic History   Marital status: Single    Spouse name: Not on file   Number of children: Not on file   Years of education: Not on file   Highest education level: Not on file  Occupational History   Not on file  Tobacco Use   Smoking status: Never   Smokeless tobacco: Never  Vaping Use   Vaping Use: Never used  Substance and Sexual Activity   Alcohol use: Yes    Comment: occassionally    Drug use: No    Sexual activity: Not on file  Other Topics Concern   Not on file  Social History Narrative   Not on file   Social Determinants of Health   Financial Resource Strain: Not on file  Food Insecurity: Not on file  Transportation Needs: Not on file  Physical Activity: Sufficiently Active   Days of Exercise per Week: 5 days   Minutes of Exercise per Session: 60 min  Stress: Not on file  Social Connections: Not on file   Outpatient Medications Prior to Visit  Medication Sig   [DISCONTINUED] Norgestimate-Eth Estradiol (SPRINTEC 28 PO) Take by mouth. (Patient not taking: Reported on 01/28/2021)   [DISCONTINUED] promethazine-dextromethorphan (PROMETHAZINE-DM) 6.25-15 MG/5ML syrup Take 5 mLs by mouth 4 (four) times daily as needed for cough. (Patient not taking: Reported on 01/28/2021)   No facility-administered medications prior to visit.   No Known Allergies  Immunization History  Administered Date(s) Administered   Influenza-Unspecified 12/19/2020   Janssen (J&J) SARS-COV-2 Vaccination 05/19/2019   PFIZER(Purple Top)SARS-COV-2 Vaccination 05/19/2019, 06/09/2019    Health Maintenance  Topic Date Due   HPV VACCINES (1 - 2-dose series) Never done   HIV Screening  Never done   Hepatitis C Screening  Never done   TETANUS/TDAP  Never done   PAP-Cervical Cytology Screening  Never done   PAP SMEAR-Modifier  Never done   COVID-19 Vaccine (3 - Booster for Janssen series) 08/04/2019   INFLUENZA VACCINE  Completed   Pneumococcal Vaccine 103-23 Years old  Aged Out    Patient Care Team: Rayden Scheper, Coralee Pesa, NP as PCP - General (Nurse Practitioner)  Review of Systems All review of systems negative except what is listed in the HPI   Objective    BP 117/72   Pulse 86   Ht 5' 4"  (1.626 m)   Wt 155 lb (70.3 kg)   SpO2 97%   BMI 26.61 kg/m  Physical Exam Vitals and nursing note reviewed.  Constitutional:      General: She is not in acute distress.    Appearance: Normal appearance.  HENT:      Head: Normocephalic and atraumatic.     Right Ear: Hearing, tympanic membrane, ear canal and external ear normal.     Left Ear: Hearing, tympanic membrane, ear canal and external ear normal.     Nose: Nose normal.     Right Sinus: No maxillary sinus tenderness or frontal sinus tenderness.     Left Sinus: No maxillary sinus tenderness or frontal sinus tenderness.     Mouth/Throat:     Lips: Pink.     Mouth: Mucous membranes are moist.  Pharynx: Oropharynx is clear.  Eyes:     General: Lids are normal. Vision grossly intact.     Extraocular Movements: Extraocular movements intact.     Conjunctiva/sclera: Conjunctivae normal.     Pupils: Pupils are equal, round, and reactive to light.     Funduscopic exam:    Right eye: Fletcher reflex present.        Left eye: Fletcher reflex present.    Visual Fields: Right eye visual fields normal and left eye visual fields normal.  Neck:     Thyroid: No thyromegaly.     Vascular: No carotid bruit.  Cardiovascular:     Rate and Rhythm: Normal rate and regular rhythm.     Chest Wall: PMI is not displaced.     Pulses: Normal pulses.          Dorsalis pedis pulses are 2+ on the right side and 2+ on the left side.       Posterior tibial pulses are 2+ on the right side and 2+ on the left side.     Heart sounds: Normal heart sounds. No murmur heard. Pulmonary:     Effort: Pulmonary effort is normal. No respiratory distress.     Breath sounds: Normal breath sounds.  Abdominal:     General: Abdomen is flat. Bowel sounds are normal. There is no distension.     Palpations: Abdomen is soft. There is no hepatomegaly, splenomegaly or mass.     Tenderness: There is no abdominal tenderness. There is no right CVA tenderness, left CVA tenderness, guarding or rebound.  Musculoskeletal:        General: Normal range of motion.     Cervical back: Full passive range of motion without pain, normal range of motion and neck supple. No tenderness.     Right lower leg: No  edema.     Left lower leg: No edema.  Feet:     Left foot:     Toenail Condition: Left toenails are normal.  Lymphadenopathy:     Cervical: No cervical adenopathy.     Upper Body:     Right upper body: No supraclavicular adenopathy.     Left upper body: No supraclavicular adenopathy.  Skin:    General: Skin is warm and dry.     Capillary Refill: Capillary refill takes less than 2 seconds.     Nails: There is no clubbing.  Neurological:     General: No focal deficit present.     Mental Status: She is alert and oriented to person, place, and time.     GCS: GCS eye subscore is 4. GCS verbal subscore is 5. GCS motor subscore is 6.     Sensory: Sensation is intact.     Motor: Motor function is intact.     Coordination: Coordination is intact.     Gait: Gait is intact.     Deep Tendon Reflexes: Reflexes are normal and symmetric.  Psychiatric:        Attention and Perception: Attention normal.        Mood and Affect: Mood normal.        Speech: Speech normal.        Behavior: Behavior normal. Behavior is cooperative.        Thought Content: Thought content normal.        Cognition and Memory: Cognition and memory normal.        Judgment: Judgment normal.    Depression Screen PHQ 2/9 Scores 01/28/2021  PHQ -  2 Score 0   No results found for any visits on 01/28/21.  Assessment & Plan      Problem List Items Addressed This Visit     Encounter for annual physical exam - Primary    CPE today with no abnormal findings.  Review of labs from GYN performed with negative STI testing including HIV and Hep A, B, C screens.  Alk phos noted to be slightly low- will check thyroid and vitamin D today- kidney function normal on labs.  Will obtain additional labs today Need to obtain official records from GYN for abstraction into chart.  Plan to f/u in 1 year or sooner if needed.       Fatigue    Chronic fatigue of unknown etiology Appears to be healthy with well balanced diet and  activity levels Will obtain additional labs today for further evaluation GYN labs reviewed today and show no signs of anemia, kidney, liver, or electrolyte imbalance. No signs of infection.  Will monitor results and make recommendations to plan of care based on findings.       Relevant Orders   T3   T4   TSH   VITAMIN D 25 Hydroxy (Vit-D Deficiency, Fractures)   B12 and Folate Panel   Other Visit Diagnoses     Screening for endocrine, nutritional, metabolic and immunity disorder       Relevant Orders   T3   T4   TSH   VITAMIN D 25 Hydroxy (Vit-D Deficiency, Fractures)   B12 and Folate Panel   Low serum alkaline phosphatase       Relevant Orders   VITAMIN D 25 Hydroxy (Vit-D Deficiency, Fractures)        Return in about 1 year (around 01/28/2022) for CPE today- CPE in 1 year with labs.      Lucianne Smestad, Coralee Pesa, NP, DNP, AGNP-C Primary Care & Sports Medicine at [place]

## 2021-01-28 NOTE — Patient Instructions (Signed)
Thank you for choosing [facility] for your Primary Care needs. I am excited for the opportunity to partner with you to meet your health care goals. It was a pleasure meeting you today!  Recommendations from today's visit: We will check some labs today to see if we can find a cause of your fatigue.. We will let you know those results when they become available.  If you have any health concerns or needs, don't hesitate to let us know. We can always do virtual visits while you are away at school if needed.   Information on diet, exercise, and health maintenance recommendations are listed below. This is information to help you be sure you are on track for optimal health and monitoring.   Please look over this and let us know if you have any questions or if you have completed any of the health maintenance outside of [place] so that we can be sure your records are up to date.  ___________________________________________________________ About Me: I am an Adult-Geriatric Nurse Practitioner with a background in caring for patients for more than 20 years with a strong intensive care background. I provide primary care and sports medicine services to patients age 28 and older within this office. My education had a strong focus on caring for the older adult population, which I am passionate about. I am also the director of the APP Fellowship with [facility].   My desire is to provide you with the best service through preventive medicine and supportive care. I consider you a part of the medical team and value your input. I work diligently to ensure that you are heard and your needs are met in a safe and effective manner. I want you to feel comfortable with me as your provider and want you to know that your health concerns are important to me.  For your information, our office hours are: Monday, Tuesday, and Thursday 8:00 AM - 5:00 PM Wednesday and Friday 8:00 AM - 12:00  PM.   In my time away from the office I am teaching new APP's within the system and am unavailable, but my partner, Dr. Burnard Bunting is in the office for emergent needs.   If you have questions or concerns, please call our office at [phone] or send [place] a MyChart message and we will respond as quickly as possible.  ____________________________________________________________ MyChart:  For all urgent or time sensitive needs we ask that you please call the office to avoid delays. Our number is [phone]. MyChart is not constantly monitored and due to the large volume of messages a day, replies may take up to 72 business hours.  MyChart Policy: MyChart allows for you to see your visit notes, after visit summary, provider recommendations, lab and tests results, make an appointment, request refills, and contact your provider or the office for non-urgent questions or concerns. Providers are seeing patients during normal business hours and do not have built in time to review MyChart messages.  We ask that you allow a minimum of 3 business days for responses to [employer]. For this reason, please do not send urgent requests through [place]. Please call the office at [phone]. New and ongoing conditions may require a visit. We have virtual and in person visit available for your convenience.  Complex MyChart concerns may require a visit. Your provider may request you schedule a virtual or in person visit to ensure we are providing the best care possible. MyChart messages sent after  11:00 AM on Friday will not be received by the provider until Monday morning.    Lab and Test Results: You will receive your lab and test results on MyChart as soon as they are completed and results have been sent by the lab or testing facility. Due to this service, you will receive your results BEFORE your provider.  I review lab and tests results each morning prior to seeing patients. Some results require  collaboration with other providers to ensure you are receiving the most appropriate care. For this reason, we ask that you please allow a minimum of 3-5 business days from the time the ALL results have been received for your provider to receive and review lab and test results and contact you about these.  Most lab and test result comments from the provider will be sent through [place]. Your provider may recommend changes to the plan of care, follow-up visits, repeat testing, ask questions, or request an office visit to discuss these results. You may reply directly to this message or call the office at [phone] to provide information for the provider or set up an appointment. In some instances, you will be called with test results and recommendations. Please let us know if this is preferred and we will make note of this in your chart to provide this for you.    If you have not heard a response to your lab or test results in 5 business days from all results returning to [place], please call the office to let us know. We ask that you please avoid calling prior to this time unless there is an emergent concern. Due to high call volumes, this can delay the resulting process.  After Hours: For all non-emergency after hours needs, please call the office at [phone] and select the option to reach the on-call provider service. On-call services are shared between multiple [facility] offices and therefore it will not be possible to speak directly with your provider. On-call providers may provide medical advice and recommendations, but are unable to provide refills for maintenance medications.  For all emergency or urgent medical needs after normal business hours, we recommend that you seek care at the closest Urgent Care or Emergency Department to ensure appropriate treatment in a timely manner.  [facility] has a 24 hour emergency room located on the ground floor for your convenience.    Urgent Concerns During the Business Day Providers are seeing patients from 8AM to [place] with a busy schedule and are most often not able to respond to non-urgent calls until the end of the day or the next business day. If you should have URGENT concerns during the day, please call and speak to the nurse or schedule a same day appointment so that we can address your concern without delay.   Thank you, again, for choosing me as your health care partner. I appreciate your trust and look forward to learning more about you.   Worthy Keeler, DNP, AGNP-c ___________________________________________________________  Health Maintenance Recommendations Screening Testing Mammogram Every 1 -2 years based on history and risk factors Starting at age 97 Pap Smear Ages 21-39 every 3 years Ages 49-65 every 5 years with HPV testing More frequent testing may be required based on results and history Colon Cancer Screening Every 1-10 years based on test performed, risk factors, and history Starting at age 68 Bone Density Screening Every 2-10 years based on history Starting at age 107 for women Recommendations for men differ based on  medication usage, history, and risk factors AAA Screening One time ultrasound Men 65-32 years old who have every smoked Lung Cancer Screening Low Dose Lung CT every 12 months Age 69-80 years with a 30 pack-year smoking history who still smoke or who have quit within the last 15 years  Screening Labs Routine  Labs: Complete Blood Count (CBC), Complete Metabolic Panel (CMP), Cholesterol (Lipid Panel) Every 6-12 months based on history and medications May be recommended more frequently based on current conditions or previous results Hemoglobin A1c Lab Every 3-12 months based on history and previous results Starting at age 23 or earlier with diagnosis of diabetes, high cholesterol, BMI >26, and/or risk factors Frequent monitoring for patients with diabetes to ensure blood  sugar control Thyroid Panel (TSH w/ T3 & T4) Every 6 months based on history, symptoms, and risk factors May be repeated more often if on medication HIV One time testing for all patients 58 and older May be repeated more frequently for patients with increased risk factors or exposure Hepatitis C One time testing for all patients 76 and older May be repeated more frequently for patients with increased risk factors or exposure Gonorrhea, Chlamydia Every 12 months for all sexually active persons 13-24 years Additional monitoring may be recommended for those who are considered high risk or who have symptoms PSA Men 90-29 years old with risk factors Additional screening may be recommended from age 31-69 based on risk factors, symptoms, and history  Vaccine Recommendations Tetanus Booster All adults every 10 years Flu Vaccine All patients 6 months and older every year COVID Vaccine All patients 12 years and older Initial dosing with booster May recommend additional booster based on age and health history HPV Vaccine 2 doses all patients age 77-26 Dosing may be considered for patients over 26 Shingles Vaccine (Shingrix) 2 doses all adults 92 years and older Pneumonia (Pneumovax 23) All adults 27 years and older May recommend earlier dosing based on health history Pneumonia (Prevnar 94) All adults 80 years and older Dosed 1 year after Pneumovax 23  Additional Screening, Testing, and Vaccinations may be recommended on an individualized basis based on family history, health history, risk factors, and/or exposure.  __________________________________________________________  Diet Recommendations for All Patients  I recommend that all patients maintain a diet low in saturated fats, carbohydrates, and cholesterol. While this can be challenging at first, it is not impossible and small changes can make big differences.  Things to try: Decreasing the amount of soda, sweet tea, and/or juice  to one or less per day and replace with water While water is always the first choice, if you do not like water you may consider adding a water additive without sugar to improve the taste other sugar free drinks Replace potatoes with a brightly colored vegetable at dinner Use healthy oils, such as canola oil or olive oil, instead of butter or hard margarine Limit your bread intake to two pieces or less a day Replace regular pasta with low carb pasta options Bake, broil, or grill foods instead of frying Monitor portion sizes  Eat smaller, more frequent meals throughout the day instead of large meals  An important thing to remember is, if you love foods that are not great for your health, you don't have to give them up completely. Instead, allow these foods to be a reward when you have done well. Allowing yourself to still have special treats every once in a while is a nice way to tell yourself thank you for  working hard to keep yourself healthy.   Also remember that every day is a new day. If you have a bad day and "fall off the wagon", you can still climb right back up and keep moving along on your journey!  We have resources available to help you!  Some websites that may be helpful include: www.http://carter.biz/  Www.VeryWellFit.com _____________________________________________________________  Activity Recommendations for All Patients  I recommend that all adults get at least 20 minutes of moderate physical activity that elevates your heart rate at least 5 days out of the week.  Some examples include: Walking or jogging at a pace that allows you to carry on a conversation Cycling (stationary bike or outdoors) Water aerobics Yoga Weight lifting Dancing If physical limitations prevent you from putting stress on your joints, exercise in a pool or seated in a chair are excellent options.  Do determine your MAXIMUM heart rate for activity: YOUR AGE - 220 = MAX HeartRate   Remember! Do not  push yourself too hard.  Start slowly and build up your pace, speed, weight, time in exercise, etc.  Allow your body to rest between exercise and get good sleep. You will need more water than normal when you are exerting yourself. Do not wait until you are thirsty to drink. Drink with a purpose of getting in at least 8, 8 ounce glasses of water a day plus more depending on how much you exercise and sweat.    If you begin to develop dizziness, chest pain, abdominal pain, jaw pain, shortness of breath, headache, vision changes, lightheadedness, or other concerning symptoms, stop the activity and allow your body to rest. If your symptoms are severe, seek emergency evaluation immediately. If your symptoms are concerning, but not severe, please let us know so that we can recommend further evaluation.

## 2021-01-28 NOTE — Assessment & Plan Note (Signed)
CPE today with no abnormal findings.  Review of labs from GYN performed with negative STI testing including HIV and Hep A, B, C screens.  Alk phos noted to be slightly low- will check thyroid and vitamin D today- kidney function normal on labs.  Will obtain additional labs today Need to obtain official records from GYN for abstraction into chart.  Plan to f/u in 1 year or sooner if needed.

## 2021-01-28 NOTE — Assessment & Plan Note (Signed)
Chronic fatigue of unknown etiology Appears to be healthy with well balanced diet and activity levels Will obtain additional labs today for further evaluation GYN labs reviewed today and show no signs of anemia, kidney, liver, or electrolyte imbalance. No signs of infection.  Will monitor results and make recommendations to plan of care based on findings.

## 2021-01-29 LAB — VITAMIN D 25 HYDROXY (VIT D DEFICIENCY, FRACTURES): Vit D, 25-Hydroxy: 32.9 ng/mL (ref 30.0–100.0)

## 2021-01-29 LAB — T4: T4, Total: 7.5 ug/dL (ref 4.5–12.0)

## 2021-01-29 LAB — T3: T3, Total: 128 ng/dL (ref 71–180)

## 2021-01-29 LAB — B12 AND FOLATE PANEL
Folate: 13.1 ng/mL (ref >3.0)
Vitamin B-12: 860 pg/mL (ref 232–1245)

## 2021-01-29 LAB — TSH: TSH: 0.702 u[IU]/mL (ref 0.450–4.500)

## 2021-02-08 ENCOUNTER — Ambulatory Visit (HOSPITAL_BASED_OUTPATIENT_CLINIC_OR_DEPARTMENT_OTHER): Payer: BC Managed Care – PPO | Admitting: Nurse Practitioner

## 2021-12-26 DIAGNOSIS — Z111 Encounter for screening for respiratory tuberculosis: Secondary | ICD-10-CM | POA: Diagnosis not present

## 2022-01-09 DIAGNOSIS — Z713 Dietary counseling and surveillance: Secondary | ICD-10-CM | POA: Diagnosis not present

## 2022-01-12 DIAGNOSIS — Z124 Encounter for screening for malignant neoplasm of cervix: Secondary | ICD-10-CM | POA: Diagnosis not present

## 2022-01-12 DIAGNOSIS — Z01419 Encounter for gynecological examination (general) (routine) without abnormal findings: Secondary | ICD-10-CM | POA: Diagnosis not present

## 2022-01-12 DIAGNOSIS — Z113 Encounter for screening for infections with a predominantly sexual mode of transmission: Secondary | ICD-10-CM | POA: Diagnosis not present

## 2022-01-30 ENCOUNTER — Encounter (HOSPITAL_BASED_OUTPATIENT_CLINIC_OR_DEPARTMENT_OTHER): Payer: BC Managed Care – PPO | Admitting: Nurse Practitioner

## 2022-02-06 ENCOUNTER — Ambulatory Visit (INDEPENDENT_AMBULATORY_CARE_PROVIDER_SITE_OTHER): Payer: BC Managed Care – PPO | Admitting: Family Medicine

## 2022-02-06 ENCOUNTER — Encounter (HOSPITAL_BASED_OUTPATIENT_CLINIC_OR_DEPARTMENT_OTHER): Payer: BC Managed Care – PPO | Admitting: Nurse Practitioner

## 2022-02-06 ENCOUNTER — Encounter (HOSPITAL_BASED_OUTPATIENT_CLINIC_OR_DEPARTMENT_OTHER): Payer: Self-pay | Admitting: Family Medicine

## 2022-02-06 VITALS — BP 114/66 | HR 53 | Temp 97.6°F | Ht 64.0 in | Wt 163.2 lb

## 2022-02-06 DIAGNOSIS — Z0000 Encounter for general adult medical examination without abnormal findings: Secondary | ICD-10-CM | POA: Diagnosis not present

## 2022-02-06 NOTE — Assessment & Plan Note (Addendum)
Routine HCM labs ordered. HCM reviewed/discussed. Anticipatory guidance regarding healthy weight, lifestyle and choices given. Recommend healthy diet.  Recommend approximately 150 minutes/week of moderate intensity exercise Recommend regular dental and vision exams Always use seatbelt/lap and shoulder restraints Recommend using smoke alarms and checking batteries at least twice a year Recommend using sunscreen when outside Discussed tetanus immunization recommendations, patient reports being UTD Recommend seasonal influenza vaccine, patient received earlier this season

## 2022-02-06 NOTE — Patient Instructions (Signed)
  Medication Instructions:  Your physician recommends that you continue on your current medications as directed. Please refer to the Current Medication list given to you today. --If you need a refill on any your medications before your next appointment, please call your pharmacy first. If no refills are authorized on file call the office.-- Lab Work: Your physician has recommended that you have lab work today: Nurse Visit for labs If you have labs (blood work) drawn today and your tests are completely normal, you will receive your results via MyChart message OR a phone call from our staff.  Please ensure you check your voicemail in the event that you authorized detailed messages to be left on a delegated number. If you have any lab test that is abnormal or we need to change your treatment, we will call you to review the results.  Referrals/Procedures/Imaging: No  Follow-Up: Your next appointment:   Your physician recommends that you schedule a follow-up appointment in: 1 year physical with Dr. de Peru.  You will receive a text message or e-mail with a link to a survey about your care and experience with [place] today! We would greatly appreciate your feedback!   Thanks for letting us be apart of your health journey!!  Primary Care and Sports Medicine   Dr. Ceasar Mons Peru   We encourage you to activate your patient portal called "MyChart".  Sign up information is provided on this After Visit Summary.  MyChart is used to connect with patients for Virtual Visits (Telemedicine).  Patients are able to view lab/test results, encounter notes, upcoming appointments, etc.  Non-urgent messages can be sent to your provider as well. To learn more about what you can do with MyChart, please visit --  [email or web address].

## 2022-02-06 NOTE — Progress Notes (Signed)
Subjective:    CC: Annual Physical Exam  HPI:  Carrie Fletcher is a 26 y.o. presenting for annual physical  I reviewed the past medical history, family history, social history, surgical history, and allergies today and no changes were needed.  Please see the problem list section below in epic for further details.  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   Past Surgical History: Past Surgical History:  Procedure Laterality Date   BREAST REDUCTION SURGERY Bilateral 02/03/2020   Procedure: MAMMARY REDUCTION  (BREAST);  Surgeon: Contogiannis, Chales Abrahams, MD;  Location: [facility];  Service: Plastics;  Laterality: Bilateral;   NO PAST SURGERIES     Social History: Social History   Socioeconomic History   Marital status: Single    Spouse name: Not on file   Number of children: Not on file   Years of education: Not on file   Highest education level: Not on file  Occupational History   Not on file  Tobacco Use   Smoking status: Never   Smokeless tobacco: Never  Vaping Use   Vaping Use: Never used  Substance and Sexual Activity   Alcohol use: Yes    Comment: occassionally    Drug use: No   Sexual activity: Not on file  Other Topics Concern   Not on file  Social History Narrative   Not on file   Social Determinants of Health   Financial Resource Strain: Not on file  Food Insecurity: Not on file  Transportation Needs: Not on file  Physical Activity: Sufficiently Active (01/28/2021)   Exercise Vital Sign    Days of Exercise per Week: 5 days    Minutes of Exercise per Session: 60 min  Stress: Not on file  Social Connections: Not on file   Family History: Family History  Problem Relation Age of Onset   Cardiomyopathy Father    Allergies: No Known Allergies Medications: See med rec.  Review of Systems: No headache,  visual changes, nausea, vomiting, diarrhea, constipation, dizziness, abdominal pain, skin rash, fevers, chills, night sweats, swollen lymph nodes, weight loss, chest pain, body aches, joint swelling, muscle aches, shortness of breath, mood changes, visual or auditory hallucinations.  Objective:    BP 114/66 (BP Location: Right Arm, Patient Position: Sitting, Cuff Size: Large)   Pulse (!) 53   Temp 97.6 F (36.4 C) (Oral)   Ht 5\' 4"  (1.626 m)   Wt 163 lb 3.2 oz (74 kg)   SpO2 100%   BMI 28.01 kg/m   General: Well Developed, well nourished, and in no acute distress. Neuro: Alert and oriented x3, extra-ocular muscles intact, sensation grossly intact. Cranial nerves II through XII are intact, motor, sensory, and coordinative functions are all intact. HEENT: Normocephalic, atraumatic, pupils equal round reactive to light, neck supple, no masses, no lymphadenopathy, thyroid nonpalpable. Oropharynx, nasopharynx, external ear canals are unremarkable. Skin: Warm and dry, no rashes noted. Cardiac: Regular rate and rhythm, no murmurs rubs or gallops. Respiratory: Clear to auscultation bilaterally. Not using accessory muscles, speaking in full sentences. Abdominal: Soft, nontender, nondistended, positive bowel sounds, no masses, no organomegaly. Musculoskeletal: Shoulder, elbow, wrist, hip, knee, ankle stable, and with full range of motion.  Impression and Recommendations:    Wellness examination Routine HCM labs ordered. HCM reviewed/discussed. Anticipatory guidance regarding healthy weight, lifestyle and choices given. Recommend healthy diet.  Recommend approximately  150 minutes/week of moderate intensity exercise Recommend regular dental and vision exams Always use seatbelt/lap and shoulder restraints Recommend using smoke alarms and checking batteries at least twice a year Recommend using sunscreen when outside Discussed tetanus immunization recommendations, patient reports being  UTD Recommend seasonal influenza vaccine, patient received earlier this season  Return in about 1 year (around 02/07/2023) for CPE.   ___________________________________________ Tashon Capp de Peru, MD, ABFM, CAQSM Primary Care and Sports Medicine [facility]

## 2022-02-13 ENCOUNTER — Ambulatory Visit (HOSPITAL_BASED_OUTPATIENT_CLINIC_OR_DEPARTMENT_OTHER): Payer: BC Managed Care – PPO

## 2022-02-13 DIAGNOSIS — Z0000 Encounter for general adult medical examination without abnormal findings: Secondary | ICD-10-CM | POA: Diagnosis not present

## 2022-02-14 ENCOUNTER — Other Ambulatory Visit (HOSPITAL_BASED_OUTPATIENT_CLINIC_OR_DEPARTMENT_OTHER): Payer: Self-pay | Admitting: Family Medicine

## 2022-02-14 DIAGNOSIS — D709 Neutropenia, unspecified: Secondary | ICD-10-CM

## 2022-02-14 LAB — COMPREHENSIVE METABOLIC PANEL
ALT: 16 IU/L (ref 0–32)
AST: 43 IU/L — ABNORMAL HIGH (ref 0–40)
Albumin/Globulin Ratio: 2 (ref 1.2–2.2)
Albumin: 4.3 g/dL (ref 4.0–5.0)
Alkaline Phosphatase: 49 IU/L (ref 44–121)
BUN/Creatinine Ratio: 8 — ABNORMAL LOW (ref 9–23)
BUN: 6 mg/dL (ref 6–20)
Bilirubin Total: 0.8 mg/dL (ref 0.0–1.2)
CO2: 22 mmol/L (ref 20–29)
Calcium: 9.9 mg/dL (ref 8.7–10.2)
Chloride: 102 mmol/L (ref 96–106)
Creatinine, Ser: 0.75 mg/dL (ref 0.57–1.00)
Globulin, Total: 2.2 g/dL (ref 1.5–4.5)
Glucose: 89 mg/dL (ref 70–99)
Potassium: 4.1 mmol/L (ref 3.5–5.2)
Sodium: 137 mmol/L (ref 134–144)
Total Protein: 6.5 g/dL (ref 6.0–8.5)
eGFR: 113 mL/min/{1.73_m2} (ref >59)

## 2022-02-14 LAB — CBC WITH DIFFERENTIAL/PLATELET
Basophils Absolute: 0 10*3/uL (ref 0.0–0.2)
Basos: 1 %
EOS (ABSOLUTE): 0.1 10*3/uL (ref 0.0–0.4)
Eos: 3 %
Hematocrit: 39.7 % (ref 34.0–46.6)
Hemoglobin: 13.7 g/dL (ref 11.1–15.9)
Immature Grans (Abs): 0 10*3/uL (ref 0.0–0.1)
Immature Granulocytes: 0 %
Lymphocytes Absolute: 1.9 10*3/uL (ref 0.7–3.1)
Lymphs: 59 %
MCH: 31.7 pg (ref 26.6–33.0)
MCHC: 34.5 g/dL (ref 31.5–35.7)
MCV: 92 fL (ref 79–97)
Monocytes Absolute: 0.2 10*3/uL (ref 0.1–0.9)
Monocytes: 7 %
Neutrophils Absolute: 1 10*3/uL — ABNORMAL LOW (ref 1.4–7.0)
Neutrophils: 30 %
Platelets: 310 10*3/uL (ref 150–450)
RBC: 4.32 x10E6/uL (ref 3.77–5.28)
RDW: 11 % — ABNORMAL LOW (ref 11.7–15.4)
WBC: 3.2 10*3/uL — ABNORMAL LOW (ref 3.4–10.8)

## 2022-02-14 LAB — LIPID PANEL
Chol/HDL Ratio: 2.4 ratio (ref 0.0–4.4)
Cholesterol, Total: 186 mg/dL (ref 100–199)
HDL: 79 mg/dL (ref >39)
LDL Chol Calc (NIH): 93 mg/dL (ref 0–99)
Triglycerides: 77 mg/dL (ref 0–149)
VLDL Cholesterol Cal: 14 mg/dL (ref 5–40)

## 2022-02-15 NOTE — Progress Notes (Signed)
LVM for pt to callback to schedule nurse visit in 2 to 4 weeks for labs.

## 2022-03-09 ENCOUNTER — Telehealth (HOSPITAL_BASED_OUTPATIENT_CLINIC_OR_DEPARTMENT_OTHER): Payer: Self-pay | Admitting: Family Medicine

## 2022-03-09 NOTE — Telephone Encounter (Signed)
Pt is going to have labs drawn in [place], as its closer for her. Please release the labs

## 2022-03-10 DIAGNOSIS — D709 Neutropenia, unspecified: Secondary | ICD-10-CM | POA: Diagnosis not present

## 2022-03-11 LAB — CBC WITH DIFFERENTIAL/PLATELET
Basophils Absolute: 0 10*3/uL (ref 0.0–0.2)
Basos: 1 %
EOS (ABSOLUTE): 0.1 10*3/uL (ref 0.0–0.4)
Eos: 1 %
Hematocrit: 40.9 % (ref 34.0–46.6)
Hemoglobin: 13.9 g/dL (ref 11.1–15.9)
Immature Grans (Abs): 0 10*3/uL (ref 0.0–0.1)
Immature Granulocytes: 0 %
Lymphocytes Absolute: 3 10*3/uL (ref 0.7–3.1)
Lymphs: 49 %
MCH: 31.7 pg (ref 26.6–33.0)
MCHC: 34 g/dL (ref 31.5–35.7)
MCV: 93 fL (ref 79–97)
Monocytes Absolute: 0.3 10*3/uL (ref 0.1–0.9)
Monocytes: 5 %
Neutrophils Absolute: 2.7 10*3/uL (ref 1.4–7.0)
Neutrophils: 44 %
Platelets: 393 10*3/uL (ref 150–450)
RBC: 4.39 x10E6/uL (ref 3.77–5.28)
RDW: 11.4 % — ABNORMAL LOW (ref 11.7–15.4)
WBC: 6.1 10*3/uL (ref 3.4–10.8)

## 2022-03-11 LAB — SEDIMENTATION RATE: Sed Rate: 9 mm/hr (ref 0–32)

## 2022-03-11 LAB — B12 AND FOLATE PANEL
Folate: >20 ng/mL (ref >3.0)
Vitamin B-12: 1209 pg/mL (ref 232–1245)

## 2022-03-13 LAB — PATHOLOGIST SMEAR REVIEW
Basophils Absolute: 0.1 10*3/uL (ref 0.0–0.2)
Basos: 1 %
EOS (ABSOLUTE): 0.1 10*3/uL (ref 0.0–0.4)
Eos: 1 %
Hematocrit: 40.5 % (ref 34.0–46.6)
Hemoglobin: 13.8 g/dL (ref 11.1–15.9)
Immature Grans (Abs): 0 10*3/uL (ref 0.0–0.1)
Immature Granulocytes: 0 %
Lymphocytes Absolute: 3.1 10*3/uL (ref 0.7–3.1)
Lymphs: 48 %
MCH: 31.2 pg (ref 26.6–33.0)
MCHC: 34.1 g/dL (ref 31.5–35.7)
MCV: 91 fL (ref 79–97)
Monocytes Absolute: 0.3 10*3/uL (ref 0.1–0.9)
Monocytes: 5 %
Neutrophils Absolute: 2.9 10*3/uL (ref 1.4–7.0)
Neutrophils: 45 %
Platelets: 382 10*3/uL (ref 150–450)
RBC: 4.43 x10E6/uL (ref 3.77–5.28)
RDW: 11.4 % — ABNORMAL LOW (ref 11.7–15.4)
WBC: 6.4 10*3/uL (ref 3.4–10.8)

## 2022-04-03 ENCOUNTER — Ambulatory Visit (INDEPENDENT_AMBULATORY_CARE_PROVIDER_SITE_OTHER): Payer: BC Managed Care – PPO | Admitting: Family Medicine

## 2022-04-03 ENCOUNTER — Encounter (HOSPITAL_BASED_OUTPATIENT_CLINIC_OR_DEPARTMENT_OTHER): Payer: Self-pay | Admitting: Family Medicine

## 2022-04-03 VITALS — BP 118/75 | HR 76 | Temp 98.1°F | Ht 64.0 in | Wt 165.0 lb

## 2022-04-03 DIAGNOSIS — H6690 Otitis media, unspecified, unspecified ear: Secondary | ICD-10-CM | POA: Diagnosis not present

## 2022-04-03 DIAGNOSIS — N898 Other specified noninflammatory disorders of vagina: Secondary | ICD-10-CM | POA: Diagnosis not present

## 2022-04-03 MED ORDER — CIPROFLOXACIN-DEXAMETHASONE 0.3-0.1 % OT SUSP: 4.0000 [drp] | Freq: Two times a day (BID) | OTIC | 0 refills | Status: DC

## 2022-04-03 NOTE — Assessment & Plan Note (Signed)
Reports using Carlton Adam hair removal prior to vaginal irritation.  Had a new sexual partner prior to symptoms, reports consistent condom use.  Upon physical exam, there is no erythema, no lesions, healthy and normal tissue observed.  Denies the need for sexually transmitted disease testing today, consistently uses condoms.  Instructed to keep an eye on symptoms, if any lesions/vesicles develop to have a physical assessment as soon as possible for possible culture.

## 2022-04-03 NOTE — Progress Notes (Signed)
Established Patient Office Visit  Subjective   Patient ID: Carrie Fletcher, female    DOB: 11/08/1995  Age: 27 y.o. MRN: [id number]  Chief Complaint  Patient presents with   Vaginal Problem   Ear Pain    HPI   On 03/23/22: has some vaginal sensitivity and she looked in the mirror. She had a wew sexual partner 4-5 weeks prior. Saw inflammation at that time, no vesiclse. Used Nair the Sunday before symptoms started. Symptoms have improved. Maybe a little sensitive now.  Denies vaginal discharge, denies seeing lesions, denies odor, denies pruritus, denies pain or discomfort, described as sensitive, denies dysuria, denies unprotected intercourse.  Ear pain on right for last 2 days. No fever or chills, no drainage. Has allergies, does not take anything daily. Symptoms before ear pain same as allergies symptoms, denies having URI prior to ear pain.    Review of Systems  Constitutional:  Negative for chills and fever.  HENT:  Positive for ear pain (right, has allergies.). Negative for ear discharge.   Respiratory:  Negative for shortness of breath.   Cardiovascular:  Negative for chest pain.  Gastrointestinal:  Negative for abdominal pain and vomiting.  Genitourinary:  Negative for dysuria, frequency and urgency.      Objective:     BP 118/75   Pulse 76   Temp 98.1 F (36.7 C) (Oral)   Ht 5\' 4"  (1.626 m)   Wt 165 lb (74.8 kg)   LMP 03/31/2022   SpO2 100%   BMI 28.32 kg/m    Physical Exam Vitals and nursing note reviewed. Exam conducted with a chaperone present.  HENT:     Right Ear: There is impacted cerumen. Tympanic membrane is not erythematous (unable to visualize TM).     Left Ear: Tympanic membrane is not erythematous.     Nose:     Right Turbinates: Swollen.     Left Turbinates: Swollen.     Right Sinus: No maxillary sinus tenderness or frontal sinus tenderness.     Left Sinus: No maxillary sinus tenderness or frontal sinus tenderness.     Mouth/Throat:      Pharynx: Uvula midline. No pharyngeal swelling, oropharyngeal exudate, posterior oropharyngeal erythema or uvula swelling.  Pulmonary:     Effort: Pulmonary effort is normal.  Genitourinary:    General: Normal vulva.     Exam position: Lithotomy position.     Pubic Area: No rash or pubic lice.      Labia:        Right: No rash, tenderness or lesion.        Left: No rash, tenderness or lesion.      Comments: No vaginal discharge.  No lesions, no erythema. On menstrual cycle: with tampon string present.  Skin:    General: Skin is warm and dry.  Neurological:     General: No focal deficit present.     Mental Status: Mental status is at baseline.  Psychiatric:        Mood and Affect: Mood normal.        Behavior: Behavior normal.        Thought Content: Thought content normal.        Judgment: Judgment normal.     No results found for any visits on 04/03/22.    The ASCVD Risk score (Arnett DK, et al., 2019) failed to calculate for the following reasons:   The 2019 ASCVD risk score is only valid for ages 17 to 85  Assessment & Plan:   Problem List Items Addressed This Visit     Acute otitis media - Primary    Ear pain on right for the last 2 days.  She denies fever, chills, any ear drainage.  She reports that she had her usual allergy symptoms prior to ear pain.  Unable to visualize TM due to cerumen impaction, due to pain will treat with Ciprodex otic suspension she will apply 4 drops in the right ear 2 times per day. She will follow-up if symptoms do not resolve with treatment.       Relevant Medications   ciprofloxacin-dexamethasone (CIPRODEX) OTIC suspension   Vaginal irritation    Reports using Nair hair removal prior to vaginal irritation.  Had a new sexual partner prior to symptoms, reports consistent condom use.  Upon physical exam, there is no erythema, no lesions, healthy and normal tissue observed.  Denies the need for sexually transmitted disease testing today,  consistently uses condoms.  Instructed to keep an eye on symptoms, if any lesions/vesicles develop to have a physical assessment as soon as possible for possible culture.       Return if symptoms worsen or fail to improve.    Chalmers Guest, FNP

## 2022-04-03 NOTE — Assessment & Plan Note (Addendum)
Ear pain on right for the last 2 days.  She denies fever, chills, any ear drainage.  She reports that she had her usual allergy symptoms prior to ear pain.  Unable to visualize TM due to cerumen impaction, due to pain will treat with Ciprodex otic suspension she will apply 4 drops in the right ear 2 times per day. She will follow-up if symptoms do not resolve with treatment.

## 2022-04-03 NOTE — Patient Instructions (Signed)
Instill ear drops in right ear as instructed.   Follow-up if symptoms do not resolve.

## 2022-04-15 DIAGNOSIS — N898 Other specified noninflammatory disorders of vagina: Secondary | ICD-10-CM | POA: Diagnosis not present

## 2022-06-01 DIAGNOSIS — B3731 Acute candidiasis of vulva and vagina: Secondary | ICD-10-CM | POA: Diagnosis not present

## 2022-06-01 DIAGNOSIS — Z113 Encounter for screening for infections with a predominantly sexual mode of transmission: Secondary | ICD-10-CM | POA: Diagnosis not present

## 2022-07-31 ENCOUNTER — Encounter (HOSPITAL_BASED_OUTPATIENT_CLINIC_OR_DEPARTMENT_OTHER): Payer: Self-pay | Admitting: Family Medicine

## 2022-08-07 ENCOUNTER — Telehealth (INDEPENDENT_AMBULATORY_CARE_PROVIDER_SITE_OTHER): Payer: BC Managed Care – PPO | Admitting: Family Medicine

## 2022-08-07 ENCOUNTER — Encounter (HOSPITAL_BASED_OUTPATIENT_CLINIC_OR_DEPARTMENT_OTHER): Payer: Self-pay | Admitting: Family Medicine

## 2022-08-07 DIAGNOSIS — K529 Noninfective gastroenteritis and colitis, unspecified: Secondary | ICD-10-CM | POA: Diagnosis not present

## 2022-08-07 NOTE — Progress Notes (Signed)
   Virtual Visit via Telephone   I connected with  Carrie Fletcher  on 08/07/22 by telephone/telehealth and verified that I am speaking with the correct person using two identifiers.   I discussed the limitations, risks, security and privacy concerns of performing an evaluation and management service by telephone, including the higher likelihood of inaccurate diagnosis and treatment, and the availability of in person appointments.  We also discussed the likely need of an additional face to face encounter for complete and high quality delivery of care.  I also discussed with the patient that there may be a patient responsible charge related to this service. The patient expressed understanding and wishes to proceed.  Provider location is in medical facility. Patient location is at their home, different from provider location. People involved in care of the patient during this telehealth encounter were myself, my nurse/medical assistant, and my front office/scheduling team member.  Review of Systems: No fevers, chills, night sweats, weight loss, chest pain, or shortness of breath.   Objective Findings:    General: Speaking full sentences, no audible heavy breathing.  Sounds alert and appropriately interactive.    Independent interpretation of tests performed by another provider:   None.  Brief History, Exam, Impression, and Recommendations:    Frequent stools Patient reports ongoing issue of frequent bowel movements.  This has been going on for more than 1 year.  Stools generally will be well-formed.  Occasionally will notice blood, however this is only when wiping and only when she has had more frequent bowel movements.  She has not noticed any blood in the stool itself.  She denies any weight changes, specifically no weight loss has been noted.  She has not had any issues with nausea or vomiting.  No significant problems with constipation.  No significant abdominal pain, no significant  bloating.  She does feel that she can be slight more sensitive to dairy and thus typically avoids this in her diet.  She is not aware of any family history of GI illnesses.  She does take a probiotic and strives to follow a healthy, balanced diet. Discussed considerations, discussed appropriate dietary choices, can continue with probiotic.  Patient would like referral to GI specialist for further evaluation and treatment, this is reasonable, referral placed today  I discussed the above assessment and treatment plan with the patient. The patient was provided an opportunity to ask questions and all were answered. The patient agreed with the plan and demonstrated an understanding of the instructions.   The patient was advised to call back or seek an in-person evaluation if the symptoms worsen or if the condition fails to improve as anticipated.   I provided 11 minutes of face to face and non-face-to-face time during this encounter date, time was needed to gather information, review chart, records, communicate/coordinate with staff remotely, as well as complete documentation.   ___________________________________________ Beckhem Isadore de Peru, MD, ABFM, CAQSM Primary Care and Sports Medicine [facility]

## 2022-08-07 NOTE — Assessment & Plan Note (Signed)
Patient reports ongoing issue of frequent bowel movements.  This has been going on for more than 1 year.  Stools generally will be well-formed.  Occasionally will notice blood, however this is only when wiping and only when she has had more frequent bowel movements.  She has not noticed any blood in the stool itself.  She denies any weight changes, specifically no weight loss has been noted.  She has not had any issues with nausea or vomiting.  No significant problems with constipation.  No significant abdominal pain, no significant bloating.  She does feel that she can be slight more sensitive to dairy and thus typically avoids this in her diet.  She is not aware of any family history of GI illnesses.  She does take a probiotic and strives to follow a healthy, balanced diet. Discussed considerations, discussed appropriate dietary choices, can continue with probiotic.  Patient would like referral to GI specialist for further evaluation and treatment, this is reasonable, referral placed today

## 2022-08-07 NOTE — Telephone Encounter (Signed)
Pt scheduled for a mychart visit 6/10.

## 2022-09-20 NOTE — Progress Notes (Deleted)
    Celso Amy, PA-C [address]  [address]  [place], [place] [address]  Main: [phone]  Fax: [phone]   Gastroenterology Consultation  Referring Provider:     de Peru, Buren Kos, MD Primary Care Physician:  de Peru, Buren Kos, MD Primary Gastroenterologist:  *** Reason for Consultation:     Chronic frequent stools        HPI:   Carrie Fletcher is a 27 y.o. y/o female referred for consultation & management  by de Peru, Buren Kos, MD.    Patient has frequent stools for more than a year.  Stools have been formed.  Mild occasional bright red blood on the tissue after having frequent BMs.  No blood in the stool or toilet.  No weight loss, nausea, vomiting, abdominal pain, constipation.  No family history of GI problems.  Tried probiotic.  No previous GI evaluation.  Labs 02/2022 showed normal WBC 6.1, hemoglobin 13.9.  B12 and folate normal.  CMP normal except slightly elevated AST.  Alcohol?  No abdominal imaging or stool studies.  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    Past Surgical History:  Procedure Laterality Date   BREAST REDUCTION SURGERY Bilateral 02/03/2020   Procedure: MAMMARY REDUCTION  (BREAST);  Surgeon: Contogiannis, Chales Abrahams, MD;  Location: [facility];  Service: Plastics;  Laterality: Bilateral;   NO PAST SURGERIES      Prior to Admission medications   Not on File    Family History  Problem Relation Age of Onset   Cardiomyopathy Father      Social History   Tobacco Use   Smoking status: Never   Smokeless tobacco: Never  Vaping Use   Vaping status: Never Used  Substance Use Topics   Alcohol use: Yes    Comment: occassionally    Drug use: No    Allergies as of 09/22/2022   (No Known Allergies)    Review of Systems:    All systems reviewed and negative except where noted in HPI.   Physical Exam:   There were no vitals taken for this visit. No LMP recorded. Psych:  Alert and cooperative. Normal mood and affect. General:   Alert,  Well-developed, well-nourished, pleasant and cooperative in NAD Head:  Normocephalic and atraumatic. Eyes:  Sclera clear, no icterus.   Conjunctiva pink. Neck:  Supple; no masses or thyromegaly. Lungs:  Respirations even and unlabored.  Clear throughout to auscultation.   No wheezes, crackles, or rhonchi. No acute distress. Heart:  Regular rate and rhythm; no murmurs, clicks, rubs, or gallops. Abdomen:  Normal bowel sounds.  No bruits.  Soft, and non-distended without masses, hepatosplenomegaly or hernias noted.  No Tenderness.  No guarding or rebound tenderness.    Neurologic:  Alert and oriented x3;  grossly normal neurologically. Psych:  Alert and cooperative. Normal mood and affect.  Imaging Studies: No results found.  Assessment and Plan:   Carrie Fletcher is a 27 y.o. y/o female has been referred for ***  Follow up ***  Celso Amy, PA-C    BP check ***

## 2022-09-22 ENCOUNTER — Encounter (HOSPITAL_BASED_OUTPATIENT_CLINIC_OR_DEPARTMENT_OTHER): Payer: Self-pay | Admitting: Family Medicine

## 2022-09-22 ENCOUNTER — Ambulatory Visit: Payer: BC Managed Care – PPO | Admitting: Physician Assistant

## 2022-09-26 ENCOUNTER — Encounter (INDEPENDENT_AMBULATORY_CARE_PROVIDER_SITE_OTHER): Payer: Self-pay

## 2022-10-03 ENCOUNTER — Encounter (INDEPENDENT_AMBULATORY_CARE_PROVIDER_SITE_OTHER): Payer: Self-pay | Admitting: Gastroenterology

## 2022-10-03 ENCOUNTER — Ambulatory Visit (INDEPENDENT_AMBULATORY_CARE_PROVIDER_SITE_OTHER): Payer: BC Managed Care – PPO | Admitting: Gastroenterology

## 2022-10-03 VITALS — BP 121/76 | HR 61 | Temp 97.5°F | Ht 64.0 in | Wt 164.7 lb

## 2022-10-03 DIAGNOSIS — R197 Diarrhea, unspecified: Secondary | ICD-10-CM | POA: Diagnosis not present

## 2022-10-03 DIAGNOSIS — K921 Melena: Secondary | ICD-10-CM | POA: Diagnosis not present

## 2022-10-03 NOTE — Patient Instructions (Signed)
It was very nice to meet you today, as dicussed with will plan for the following :  1) Stool studies and blood work 2) colonoscopy  3) keep a food diary

## 2022-10-03 NOTE — Progress Notes (Signed)
Vista Lawman , M.D. Gastroenterology & Hepatology [facility] Gastroenterology [address] [place], [place] [address] Primary Care Physician: de [place], Buren Kos, MD [address] [place] [address]  Chief Complaint:  Painless hematochezia , Diarrhea   History of Present Illness: Carrie Fletcher is a 27 y.o. female no significant medical problems who presents for evaluation of diarrhea and painless hematochezia  Patient reports that she has altered bowel movements for most of her life but she is recently getting.  She she reports frequent bowel movements which are liquid in a container and abdominal discomfort.  She would have days where they will be formed stools but then will have days where she will have 5-6 bowel movement in a day . she occasionally has noticed blood upon wiping, patient thinks it could be because of frequent wiping and irritation. The patient denies having any nausea, vomiting, fever, chills,  melena, hematemesis, abdominal distention, abdominal pain, jaundice, pruritus or weight loss.  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    Past Surgical History: Past Surgical History:  Procedure Laterality Date   BREAST REDUCTION SURGERY Bilateral 02/03/2020   Procedure: MAMMARY REDUCTION  (BREAST);  Surgeon: Contogiannis, Chales Abrahams, MD;  Location: [facility];  Service: Plastics;  Laterality: Bilateral;   NO PAST SURGERIES      Family History: Family History  Problem Relation Age of Onset   Cardiomyopathy Father     Social History: Social History    Tobacco Use  Smoking Status Never  Smokeless Tobacco Never   Social History   Substance and Sexual Activity  Alcohol Use Yes   Comment: occassionally    Social History   Substance and Sexual Activity  Drug Use No    Allergies: No Known Allergies  Medications: No current outpatient medications on file.   No current facility-administered medications for this visit.    Review of Systems: GENERAL: negative for malaise, night sweats HEENT: No changes in hearing or vision, no nose bleeds or other nasal problems. NECK: Negative for lumps, goiter, pain and significant neck swelling RESPIRATORY: Negative for cough, wheezing CARDIOVASCULAR: Negative for chest pain, leg swelling, palpitations, orthopnea GI: SEE HPI MUSCULOSKELETAL: Negative for joint pain or swelling, back pain, and muscle pain. SKIN: Negative for lesions, rash HEMATOLOGY Negative for prolonged bleeding, bruising easily, and swollen nodes. ENDOCRINE: Negative for cold or heat intolerance, polyuria, polydipsia and goiter. NEURO: negative for tremor, gait imbalance, syncope and seizures. The remainder of the review of systems is noncontributory.   Physical Exam: There were no vitals taken for this visit. GENERAL: The patient is AO x3, in no acute distress. HEENT: Head is normocephalic and atraumatic. EOMI are intact. Mouth is well hydrated and without lesions. NECK: Supple. No masses LUNGS: Clear to auscultation. No presence of rhonchi/wheezing/rales. Adequate chest expansion HEART: RRR, normal s1 and s2. ABDOMEN: Soft, nontender, no guarding, no peritoneal signs, and nondistended. BS +. No masses. Chaperone: Crystal LPN  EXTREMITIES: Without any cyanosis, clubbing, rash, lesions or edema. NEUROLOGIC: AOx3, no focal motor deficit. SKIN: no jaundice, no rashes   Imaging/Labs: as above  I personally reviewed and interpreted the available  labs, imaging and endoscopic files.  HBG: 13.8   Impression and  Plan: Carrie Fletcher is a 27 y.o. female no significant medical problems who presents for evaluation of diarrhea and painless hematochezia  #Diarrhea #Painless hematochezia   Patient diarrhea is likely related to to food intolerance as she is probably lactose intolerant as it is worse after dairy products. although with frequent bowel movements abdominal discomfort occasional painless hematochezia(blood on toilet paper) inflammatory bowel disease is on differential as it has a bimodal distribution  Will obtain blood work to check for celiac disease with total TTG IgA and total IgA, CRP, fecal calprotectin, elevated liver enzymes and ferritin  Given painless hematochezia ;colonoscopy is the best diagnostic modality to exclude inflammatory bowel disease, or any colonic polyp  Patient advised to keep a food diary.  Avoid dairy products  All questions were answered.      Vista Lawman, MD Gastroenterology and Hepatology [facility] Gastroenterology   This chart has been completed using [facility] Dictation software, and while attempts have been made to ensure accuracy , certain words and phrases may not be transcribed as intended

## 2022-10-04 ENCOUNTER — Telehealth (INDEPENDENT_AMBULATORY_CARE_PROVIDER_SITE_OTHER): Payer: Self-pay | Admitting: Gastroenterology

## 2022-10-04 DIAGNOSIS — R197 Diarrhea, unspecified: Secondary | ICD-10-CM | POA: Diagnosis not present

## 2022-10-04 DIAGNOSIS — K921 Melena: Secondary | ICD-10-CM | POA: Diagnosis not present

## 2022-10-04 NOTE — Telephone Encounter (Signed)
Left message to return call. Pt needing TCS with Dr.Ahmed Room 1

## 2022-10-12 DIAGNOSIS — R197 Diarrhea, unspecified: Secondary | ICD-10-CM | POA: Diagnosis not present

## 2022-10-12 DIAGNOSIS — K921 Melena: Secondary | ICD-10-CM | POA: Diagnosis not present

## 2022-11-01 ENCOUNTER — Ambulatory Visit: Payer: BC Managed Care – PPO | Admitting: Physician Assistant

## 2022-11-16 ENCOUNTER — Encounter (INDEPENDENT_AMBULATORY_CARE_PROVIDER_SITE_OTHER): Payer: Self-pay | Admitting: Gastroenterology

## 2022-11-16 NOTE — Telephone Encounter (Signed)
Hi Carrie Fletcher   As discussed in the clinic visit , because of episode of Blood per rectum I advised patient colonoscopy .   Room 1 . Which can be booked directly

## 2022-11-16 NOTE — Telephone Encounter (Signed)
I called patient to get more information. I saw on blood test results pt said she wanted to hold on colonoscopy til she got stool test results. She had dropped off stool that same day I spoke with her. She saw on mychart her stool test was normal. She states her diarrhea is better. She states she does not have it unless she eats dairy products. She wanted to know if you still recommended for her to have colonoscopy and if she needed follow up here in office?

## 2022-11-17 NOTE — Telephone Encounter (Signed)
Left message to return call 

## 2022-11-20 NOTE — Telephone Encounter (Signed)
Pt called back and has tentatively been scheduled for 12/22/22. She will confirm with her mom that this a good date. Will call back to confirm.

## 2022-11-21 ENCOUNTER — Other Ambulatory Visit (INDEPENDENT_AMBULATORY_CARE_PROVIDER_SITE_OTHER): Payer: Self-pay | Admitting: Gastroenterology

## 2022-11-21 MED ORDER — PEG 3350-KCL-NA BICARB-NACL 420 G PO SOLR: 4000.0000 mL | Freq: Once | ORAL | 0 refills | Status: AC

## 2022-11-21 NOTE — Telephone Encounter (Signed)
Pt left voicemail that she would like to be scheduled for 12/26/22 in the morning. Pt scheduled for 12/26/22 at 7:30am with Dr.Ahmed. instruction sent via my chart. Will send in prep once I hear back from pt on what pharmacy. No PA needed per insurance.

## 2022-11-21 NOTE — Addendum Note (Signed)
Addended by: Marlowe Shores on: 11/21/2022 09:45 AM   Modules accepted: Orders

## 2022-11-29 DIAGNOSIS — R21 Rash and other nonspecific skin eruption: Secondary | ICD-10-CM | POA: Diagnosis not present

## 2022-11-29 DIAGNOSIS — N898 Other specified noninflammatory disorders of vagina: Secondary | ICD-10-CM | POA: Diagnosis not present

## 2022-11-29 DIAGNOSIS — Z113 Encounter for screening for infections with a predominantly sexual mode of transmission: Secondary | ICD-10-CM | POA: Diagnosis not present

## 2022-11-30 DIAGNOSIS — M542 Cervicalgia: Secondary | ICD-10-CM | POA: Diagnosis not present

## 2022-11-30 DIAGNOSIS — M546 Pain in thoracic spine: Secondary | ICD-10-CM | POA: Diagnosis not present

## 2022-12-04 DIAGNOSIS — R21 Rash and other nonspecific skin eruption: Secondary | ICD-10-CM | POA: Diagnosis not present

## 2022-12-18 DIAGNOSIS — M546 Pain in thoracic spine: Secondary | ICD-10-CM | POA: Diagnosis not present

## 2022-12-18 DIAGNOSIS — M542 Cervicalgia: Secondary | ICD-10-CM | POA: Diagnosis not present

## 2022-12-25 ENCOUNTER — Other Ambulatory Visit (HOSPITAL_COMMUNITY)
Admission: RE | Admit: 2022-12-25 | Discharge: 2022-12-25 | Disposition: A | Discharge status: Home / Self Care | Payer: BC Managed Care – PPO | Source: Ambulatory Visit | Attending: Gastroenterology | Admitting: Gastroenterology

## 2022-12-25 DIAGNOSIS — K921 Melena: Secondary | ICD-10-CM | POA: Insufficient documentation

## 2022-12-25 DIAGNOSIS — K529 Noninfective gastroenteritis and colitis, unspecified: Secondary | ICD-10-CM | POA: Diagnosis not present

## 2022-12-25 DIAGNOSIS — R197 Diarrhea, unspecified: Secondary | ICD-10-CM | POA: Insufficient documentation

## 2022-12-25 DIAGNOSIS — D124 Benign neoplasm of descending colon: Secondary | ICD-10-CM | POA: Diagnosis not present

## 2022-12-25 DIAGNOSIS — K648 Other hemorrhoids: Secondary | ICD-10-CM | POA: Diagnosis not present

## 2022-12-25 LAB — PREGNANCY, URINE: Preg Test, Ur: NEGATIVE

## 2022-12-26 ENCOUNTER — Encounter (HOSPITAL_COMMUNITY)
Admission: RE | Disposition: A | Discharge status: Home / Self Care | Payer: Self-pay | Source: Home / Self Care | Attending: Gastroenterology

## 2022-12-26 ENCOUNTER — Other Ambulatory Visit: Payer: Self-pay

## 2022-12-26 ENCOUNTER — Encounter (HOSPITAL_COMMUNITY): Payer: Self-pay

## 2022-12-26 ENCOUNTER — Encounter (INDEPENDENT_AMBULATORY_CARE_PROVIDER_SITE_OTHER): Payer: Self-pay | Admitting: *Deleted

## 2022-12-26 ENCOUNTER — Ambulatory Visit (HOSPITAL_COMMUNITY): Payer: BC Managed Care – PPO | Admitting: Certified Registered"

## 2022-12-26 ENCOUNTER — Ambulatory Visit (HOSPITAL_COMMUNITY)
Admission: RE | Admit: 2022-12-26 | Discharge: 2022-12-26 | Disposition: A | Discharge status: Home / Self Care | Payer: BC Managed Care – PPO | Attending: Gastroenterology | Admitting: Gastroenterology

## 2022-12-26 DIAGNOSIS — K648 Other hemorrhoids: Secondary | ICD-10-CM | POA: Diagnosis not present

## 2022-12-26 DIAGNOSIS — D124 Benign neoplasm of descending colon: Secondary | ICD-10-CM | POA: Diagnosis not present

## 2022-12-26 DIAGNOSIS — K921 Melena: Secondary | ICD-10-CM | POA: Diagnosis not present

## 2022-12-26 DIAGNOSIS — K6389 Other specified diseases of intestine: Secondary | ICD-10-CM | POA: Diagnosis not present

## 2022-12-26 DIAGNOSIS — K529 Noninfective gastroenteritis and colitis, unspecified: Secondary | ICD-10-CM | POA: Diagnosis not present

## 2022-12-26 DIAGNOSIS — K635 Polyp of colon: Secondary | ICD-10-CM | POA: Diagnosis not present

## 2022-12-26 DIAGNOSIS — K644 Residual hemorrhoidal skin tags: Secondary | ICD-10-CM | POA: Diagnosis not present

## 2022-12-26 DIAGNOSIS — D125 Benign neoplasm of sigmoid colon: Secondary | ICD-10-CM

## 2022-12-26 HISTORY — PX: BIOPSY: SHX5522

## 2022-12-26 HISTORY — PX: COLONOSCOPY WITH PROPOFOL: SHX5780

## 2022-12-26 HISTORY — PX: POLYPECTOMY: SHX5525

## 2022-12-26 LAB — HM COLONOSCOPY

## 2022-12-26 SURGERY — COLONOSCOPY WITH PROPOFOL
Anesthesia: General

## 2022-12-26 MED ORDER — PROPOFOL 500 MG/50ML IV EMUL
INTRAVENOUS | Status: DC | PRN
  Administered 2022-12-26: 200 ug/kg/min via INTRAVENOUS

## 2022-12-26 MED ORDER — SODIUM CHLORIDE 0.9% FLUSH: 10.0000 mL | Freq: Two times a day (BID) | INTRAVENOUS | Status: DC

## 2022-12-26 MED ORDER — PROPOFOL 10 MG/ML IV BOLUS
INTRAVENOUS | Status: DC | PRN
  Administered 2022-12-26: 100 mg via INTRAVENOUS
  Administered 2022-12-26: 50 mg via INTRAVENOUS

## 2022-12-26 MED ORDER — MIDAZOLAM HCL 2 MG/2ML IJ SOLN
INTRAMUSCULAR | Status: AC
  Filled 2022-12-26: qty 2

## 2022-12-26 MED ORDER — METAMUCIL SMOOTH TEXTURE 58.6 % PO POWD
1.0000 | Freq: Every day | ORAL | 12 refills | Status: AC
Start: 2022-12-26 — End: 2023-03-26

## 2022-12-26 MED ORDER — MIDAZOLAM HCL 2 MG/2ML IJ SOLN
INTRAMUSCULAR | Status: DC | PRN
  Administered 2022-12-26: 2 mg via INTRAVENOUS

## 2022-12-26 MED ORDER — LACTATED RINGERS IV SOLN: INTRAVENOUS | Status: DC | PRN

## 2022-12-26 MED ORDER — LIDOCAINE HCL (CARDIAC) PF 100 MG/5ML IV SOSY
PREFILLED_SYRINGE | INTRAVENOUS | Status: DC | PRN
  Administered 2022-12-26: 50 mg via INTRAVENOUS

## 2022-12-26 NOTE — Discharge Instructions (Addendum)
  Discharge instructions Please read the instructions outlined below and refer to this sheet in the next few weeks. These discharge instructions provide you with general information on caring for yourself after you leave the hospital. Your doctor may also give you specific instructions. While your treatment has been planned according to the most current medical practices available, unavoidable complications occasionally occur. If you have any problems or questions after discharge, please call your doctor. ACTIVITY You may resume your regular activity but move at a slower pace for the next 24 hours.  Take frequent rest periods for the next 24 hours.  Walking will help expel (get rid of) the air and reduce the bloated feeling in your abdomen.  No driving for 24 hours (because of the anesthesia (medicine) used during the test).  You may shower.  Do not sign any important legal documents or operate any machinery for 24 hours (because of the anesthesia used during the test).  NUTRITION Drink plenty of fluids.  You may resume your normal diet.  Begin with a light meal and progress to your normal diet.  Avoid alcoholic beverages for 24 hours or as instructed by your caregiver.  MEDICATIONS You may resume your normal medications unless your caregiver tells you otherwise.  WHAT YOU CAN EXPECT TODAY You may experience abdominal discomfort such as a feeling of fullness or "gas" pains.  FOLLOW-UP Your doctor will discuss the results of your test with you.  SEEK IMMEDIATE MEDICAL ATTENTION IF ANY OF THE FOLLOWING OCCUR: Excessive nausea (feeling sick to your stomach) and/or vomiting.  Severe abdominal pain and distention (swelling).  Trouble swallowing.  Temperature over 101 F (37.8 C).  Rectal bleeding or vomiting of blood.     Ensure adequate fluid intake: Aim for 8 glasses of water daily. Follow a high fiber diet: Include foods such as dates, prunes, pears, and kiwi. Use Metamucil daily.  I  hope you have a great rest of your week!   Vista Lawman , M.D.. Gastroenterology and Hepatology [facility]

## 2022-12-26 NOTE — H&P (Signed)
Primary Care Physician:  de [place], Buren Kos, MD Primary Gastroenterologist:  Dr. Tasia Catchings  Pre-Procedure History & Physical: HPI:  Carrie Fletcher is a 27 y.o. female no significant medical problems who presents for evaluation of diarrhea and painless hematochezia.  Patient reports that she has altered bowel movements for most of her life but she is recently getting.  She she reports frequent bowel movements which are liquid in a container and abdominal discomfort.  She would have days where they will be formed stools but then will have days where she will have 5-6 bowel movement in a day . she occasionally has noticed blood upon wiping, patient thinks it could be because of frequent wiping and irritation. The patient denies having any nausea, vomiting, fever, chills,  melena, hematemesis, abdominal distention, abdominal pain, jaundice, pruritus or weight loss.  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 Authorizing Provider  OVER THE COUNTER MEDICATION Probiotic daily  Multi vitamin daily  Vit D 3 daily   Yes [provider]    Allergies as of 11/21/2022   (No Known Allergies)    Family History  Problem Relation Age of Onset   Cardiomyopathy Father     Social History   Socioeconomic History   Marital status: Single    Spouse name: Not on file   Number of children: Not on file   Years of education: Not on file    Highest education level: Not on file  Occupational History   Not on file  Tobacco Use   Smoking status: Never   Smokeless tobacco: Never  Vaping Use   Vaping status: Never Used  Substance and Sexual Activity   Alcohol use: Yes    Comment: occassionally    Drug use: No   Sexual activity: Not on file  Other Topics Concern   Not on file  Social History Narrative   Not on file   Social Determinants of Health   Financial Resource Strain: Not on file  Food Insecurity: Not on file  Transportation Needs: Not on file  Physical Activity: Sufficiently Active (01/28/2021)   Exercise Vital Sign    Days of Exercise per Week: 5 days    Minutes of Exercise per Session: 60 min  Stress: Not on file  Social Connections: Not on file  Intimate Partner Violence: Not At Risk (01/28/2021)   Humiliation, Afraid, Rape, and Kick questionnaire    Fear of Current or Ex-Partner: No    Emotionally Abused: No    Physically Abused: No    Sexually Abused: No    Review of Systems: See HPI, otherwise negative ROS  Physical Exam: Vital signs in last 24 hours: Temp:  [98.3 F (36.8 C)] 98.3 F (36.8 C) (10/29 0717) Pulse Rate:  [52] 52 (10/29 0717) Resp:  [16] 16 (10/29 0717) BP: (106)/(76) 106/76 (10/29 0717) SpO2:  [100 %] 100 % (10/29 0717) Weight:  [72.6 kg]  72.6 kg (10/29 0717)   General:   Alert,  Well-developed, well-nourished, pleasant and cooperative in NAD Head:  Normocephalic and atraumatic. Eyes:  Sclera clear, no icterus.   Conjunctiva pink. Ears:  Normal auditory acuity. Nose:  No deformity, discharge,  or lesions. Msk:  Symmetrical without gross deformities. Normal posture. Extremities:  Without clubbing or edema. Neurologic:  Alert and  oriented x4;  grossly normal neurologically. Skin:  Intact without significant lesions or rashes. Psych:  Alert and cooperative. Normal mood and affect.  Impression/Plan: Carrie Fletcher is a 27 y.o. female no significant medical problems who  presents for evaluation of diarrhea and painless hematochezia. Proceed with colonoscopy   The risks of the procedure including infection, bleed, or perforation as well as benefits, limitations, alternatives and imponderables have been reviewed with the patient. Questions have been answered. All parties agreeable.

## 2022-12-26 NOTE — Anesthesia Preprocedure Evaluation (Signed)
Anesthesia Evaluation  Patient identified by MRN, date of birth, ID band Patient awake    Reviewed: Allergy & Precautions, H&P , NPO status , Patient's Chart, lab work & pertinent test results, reviewed documented beta blocker date and time   Airway Mallampati: II  TM Distance: >3 FB Neck ROM: full    Dental no notable dental hx.    Pulmonary neg pulmonary ROS   Pulmonary exam normal breath sounds clear to auscultation       Cardiovascular Exercise Tolerance: Good negative cardio ROS  Rhythm:regular Rate:Normal     Neuro/Psych negative neurological ROS  negative psych ROS   GI/Hepatic negative GI ROS, Neg liver ROS,,,  Endo/Other  negative endocrine ROS    Renal/GU negative Renal ROS  negative genitourinary   Musculoskeletal   Abdominal   Peds  Hematology negative hematology ROS (+)   Anesthesia Other Findings   Reproductive/Obstetrics negative OB ROS                             Anesthesia Physical Anesthesia Plan  ASA: 2  Anesthesia Plan: General   Post-op Pain Management:    Induction:   PONV Risk Score and Plan: Propofol infusion  Airway Management Planned:   Additional Equipment:   Intra-op Plan:   Post-operative Plan:   Informed Consent: I have reviewed the patients History and Physical, chart, labs and discussed the procedure including the risks, benefits and alternatives for the proposed anesthesia with the patient or authorized representative who has indicated his/her understanding and acceptance.     Dental Advisory Given  Plan Discussed with: CRNA  Anesthesia Plan Comments:        Anesthesia Quick Evaluation  

## 2022-12-26 NOTE — Anesthesia Procedure Notes (Signed)
Date/Time: 12/26/2022 7:36 AM  Performed by: Julian Reil, CRNAPre-anesthesia Checklist: Patient identified, Emergency Drugs available, Suction available and Patient being monitored Patient Re-evaluated:Patient Re-evaluated prior to induction Oxygen Delivery Method: Nasal cannula Induction Type: IV induction Placement Confirmation: positive ETCO2

## 2022-12-26 NOTE — Op Note (Signed)
[facility] Patient Name: Elleen Fletcher Procedure Date: 12/26/2022 7:13 AM MRN: [id number] Date of Birth: Jul 16, 1995 Attending MD: Sanjuan Dame , MD, [id number] CSN: [id number] Age: 27 Admit Type: Outpatient Procedure:                Colonoscopy Indications:              Chronic diarrhea, Hematochezia Providers:                Sanjuan Dame, MD, Edrick Kins, RN, Elinor Parkinson Referring MD:              Medicines:                Monitored Anesthesia Care Complications:            No immediate complications. Estimated Blood Loss:     Estimated blood loss: none. Procedure:                Pre-Anesthesia Assessment:                           - Prior to the procedure, a History and Physical                            was performed, and patient medications and                            allergies were reviewed. The patient's tolerance of                            previous anesthesia was also reviewed. The risks                            and benefits of the procedure and the sedation                            options and risks were discussed with the patient.                            All questions were answered, and informed consent                            was obtained. Prior Anticoagulants: The patient has                            taken no anticoagulant or antiplatelet agents. ASA                            Grade Assessment: I - A normal, healthy patient.                            After reviewing the risks and benefits, the patient  was deemed in satisfactory condition to undergo the                            procedure.                           After obtaining informed consent, the colonoscope                            was passed under direct vision. Throughout the                            procedure, the patient's blood pressure, pulse, and                            oxygen saturations were monitored  continuously. The                            [id number]) scope was introduced through the                            anus and advanced to the the terminal ileum. The                            terminal ileum, ileocecal valve, appendiceal                            orifice, and rectum were photographed. Scope In: 7:42:57 AM Scope Out: 8:03:04 AM Scope Withdrawal Time: 0 hours 16 minutes 45 seconds  Total Procedure Duration: 0 hours 20 minutes 7 seconds  Findings:      The perianal and digital rectal examinations were normal.      There is no endoscopic evidence of bleeding or inflammation in the       entire colon. Biopsies for histology were taken with a cold forceps from       the right colon and left colon for evaluation of microscopic colitis.      A 5 mm polyp was found in the descending colon. The polyp was sessile.       The polyp was removed with a cold snare. Resection and retrieval were       complete.      The terminal ileum appeared normal. Biopsies were taken with a cold       forceps for histology.      Non-bleeding external and internal hemorrhoids were found during       retroflexion. The hemorrhoids were small. Impression:               - One 5 mm polyp in the descending colon, removed                            with a cold snare. Resected and retrieved.                           - The examined portion of the ileum was normal.  Biopsied.                           - Non-bleeding external and internal hemorrhoids. Moderate Sedation:      Per Anesthesia Care Recommendation:           - Patient has a contact number available for                            emergencies. The signs and symptoms of potential                            delayed complications were discussed with the                            patient. Return to normal activities tomorrow.                            Written discharge instructions were provided to the                             patient.                           - Resume previous diet.                           - Continue present medications.                           - Await pathology results.                           - Repeat colonoscopy for surveillance based on                            pathology results.                           - Return to primary care physician as previously                            scheduled. Procedure Code(s):        --- Professional ---                           [phone], Colonoscopy, flexible; with removal of                            tumor(s), polyp(s), or other lesion(s) by snare                            technique                           45380, 59, Colonoscopy, flexible; with biopsy,  single or multiple Diagnosis Code(s):        --- Professional ---                           D12.4, Benign neoplasm of descending colon                           K52.9, Noninfective gastroenteritis and colitis,                            unspecified                           K92.1, Melena (includes Hematochezia) CPT copyright 2022 American Medical Association. All rights reserved. The codes documented in this report are preliminary and upon coder review may  be revised to meet current compliance requirements. Sanjuan Dame, MD Sanjuan Dame, MD 12/26/2022 8:10:50 AM This report has been signed electronically. Number of Addenda: 0

## 2022-12-26 NOTE — Transfer of Care (Signed)
Immediate Anesthesia Transfer of Care Note  Patient: Carrie Fletcher  Procedure(s) Performed: COLONOSCOPY WITH PROPOFOL BIOPSY POLYPECTOMY  Patient Location: Endoscopy Unit  Anesthesia Type:General  Level of Consciousness: awake, alert , and oriented  Airway & Oxygen Therapy: Patient Spontanous Breathing  Post-op Assessment: Report given to RN and Post -op Vital signs reviewed and stable  Post vital signs: Reviewed and stable  Last Vitals:  Vitals Value Taken Time  BP    Temp    Pulse    Resp    SpO2      Last Pain:  Vitals:   12/26/22 0737  TempSrc:   PainSc: 0-No pain      Patients Stated Pain Goal: 7 (12/26/22 0717)  Complications: No notable events documented.

## 2022-12-27 LAB — SURGICAL PATHOLOGY

## 2022-12-27 NOTE — Anesthesia Postprocedure Evaluation (Signed)
Anesthesia Post Note  Patient: Carrie Fletcher  Procedure(s) Performed: COLONOSCOPY WITH PROPOFOL BIOPSY POLYPECTOMY  Patient location during evaluation: Phase II Anesthesia Type: General Level of consciousness: awake Pain management: pain level controlled Vital Signs Assessment: post-procedure vital signs reviewed and stable Respiratory status: spontaneous breathing and respiratory function stable Cardiovascular status: blood pressure returned to baseline and stable Postop Assessment: no headache and no apparent nausea or vomiting Anesthetic complications: no Comments: Late entry   No notable events documented.   Last Vitals:  Vitals:   12/26/22 0717 12/26/22 0806  BP: 106/76 (!) 94/53  Pulse: (!) 52 62  Resp: 16 15  Temp: 36.8 C 36.5 C  SpO2: 100% 100%    Last Pain:  Vitals:   12/26/22 0806  TempSrc: Oral  PainSc: 0-No pain                 Windell Norfolk

## 2022-12-27 NOTE — Progress Notes (Signed)
I reviewed the pathology results. Ann, can you send her a letter with the findings as described below please? Repeat colonoscopy in 7 years  Thanks,  Vista Lawman, MD Gastroenterology and Hepatology [facility]  ---------------------------------------------------------------------------------------------  [facility] [address], [place], [place] [address] Phone:  [phone]   12/27/22 [name], [place]   Dear Carrie Fletcher,  I am writing to inform you that the biopsies taken during your recent endoscopic examination showed:  I am writing to let you know the results of your recent colonoscopy.  You had a total of 1 polyp removed. 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  Given these findings, it is recommended that your next colonoscopy be performed in 7 years.  Also biopsies of terminal ileum(small bowel), right and left colon are negative for microscopic colitis or inflammatory bowel disease  I recommend to take high-fiber diet and Metamucil twice daily  Please call us at [phone] if you have persistent problems or have questions about your condition that have not been fully answered at this time.  Sincerely,  Vista Lawman, MD Gastroenterology and Hepatology

## 2023-01-02 ENCOUNTER — Encounter (HOSPITAL_COMMUNITY): Payer: Self-pay | Admitting: Gastroenterology

## 2023-01-02 ENCOUNTER — Encounter (INDEPENDENT_AMBULATORY_CARE_PROVIDER_SITE_OTHER): Payer: Self-pay | Admitting: *Deleted

## 2023-01-18 DIAGNOSIS — Z6825 Body mass index (BMI) 25.0-25.9, adult: Secondary | ICD-10-CM | POA: Diagnosis not present

## 2023-01-18 DIAGNOSIS — Z113 Encounter for screening for infections with a predominantly sexual mode of transmission: Secondary | ICD-10-CM | POA: Diagnosis not present

## 2023-01-18 DIAGNOSIS — Z01419 Encounter for gynecological examination (general) (routine) without abnormal findings: Secondary | ICD-10-CM | POA: Diagnosis not present

## 2023-01-18 DIAGNOSIS — Z139 Encounter for screening, unspecified: Secondary | ICD-10-CM | POA: Diagnosis not present

## 2023-01-18 DIAGNOSIS — Z124 Encounter for screening for malignant neoplasm of cervix: Secondary | ICD-10-CM | POA: Diagnosis not present

## 2023-01-23 DIAGNOSIS — N904 Leukoplakia of vulva: Secondary | ICD-10-CM | POA: Diagnosis not present

## 2023-01-23 LAB — HM PAP SMEAR

## 2023-02-08 ENCOUNTER — Encounter (HOSPITAL_BASED_OUTPATIENT_CLINIC_OR_DEPARTMENT_OTHER): Payer: BC Managed Care – PPO | Admitting: Family Medicine

## 2023-03-21 ENCOUNTER — Encounter (HOSPITAL_BASED_OUTPATIENT_CLINIC_OR_DEPARTMENT_OTHER): Payer: Self-pay

## 2023-03-21 ENCOUNTER — Telehealth (HOSPITAL_BASED_OUTPATIENT_CLINIC_OR_DEPARTMENT_OTHER): Payer: Self-pay

## 2023-03-21 NOTE — Telephone Encounter (Signed)
 Marland Kitchen
